# Patient Record
Sex: Male | Born: 1945 | Race: White | Hispanic: No | Marital: Married | Smoking: Former smoker
Health system: Southern US, Community
[De-identification: ages and names within clinical notes are randomized; demographics above are authoritative.]

## PROBLEM LIST (undated history)

## (undated) DIAGNOSIS — I4891 Unspecified atrial fibrillation: Secondary | ICD-10-CM

## (undated) DIAGNOSIS — E785 Hyperlipidemia, unspecified: Secondary | ICD-10-CM

## (undated) DIAGNOSIS — E119 Type 2 diabetes mellitus without complications: Secondary | ICD-10-CM

## (undated) DIAGNOSIS — I2510 Atherosclerotic heart disease of native coronary artery without angina pectoris: Secondary | ICD-10-CM

## (undated) DIAGNOSIS — I340 Nonrheumatic mitral (valve) insufficiency: Secondary | ICD-10-CM

## (undated) DIAGNOSIS — C4490 Unspecified malignant neoplasm of skin, unspecified: Secondary | ICD-10-CM

## (undated) DIAGNOSIS — I071 Rheumatic tricuspid insufficiency: Secondary | ICD-10-CM

## (undated) DIAGNOSIS — I9789 Other postprocedural complications and disorders of the circulatory system, not elsewhere classified: Secondary | ICD-10-CM

## (undated) DIAGNOSIS — K219 Gastro-esophageal reflux disease without esophagitis: Secondary | ICD-10-CM

## (undated) DIAGNOSIS — IMO0002 Reserved for concepts with insufficient information to code with codable children: Secondary | ICD-10-CM

## (undated) DIAGNOSIS — M069 Rheumatoid arthritis, unspecified: Secondary | ICD-10-CM

## (undated) DIAGNOSIS — I10 Essential (primary) hypertension: Secondary | ICD-10-CM

## (undated) DIAGNOSIS — I255 Ischemic cardiomyopathy: Secondary | ICD-10-CM

## (undated) HISTORY — DX: Ischemic cardiomyopathy: I25.5

## (undated) HISTORY — DX: Unspecified atrial fibrillation: I48.91

## (undated) HISTORY — DX: Rheumatic tricuspid insufficiency: I07.1

## (undated) HISTORY — DX: Reserved for concepts with insufficient information to code with codable children: IMO0002

## (undated) HISTORY — DX: Essential (primary) hypertension: I10

## (undated) HISTORY — DX: Hyperlipidemia, unspecified: E78.5

## (undated) HISTORY — DX: Other postprocedural complications and disorders of the circulatory system, not elsewhere classified: I97.89

## (undated) HISTORY — DX: Atherosclerotic heart disease of native coronary artery without angina pectoris: I25.10

## (undated) HISTORY — DX: Nonrheumatic mitral (valve) insufficiency: I34.0

## (undated) HISTORY — DX: Other postprocedural complications and disorders of the circulatory system, not elsewhere classified: I48.91

## (undated) HISTORY — PX: INGUINAL HERNIA REPAIR: SUR1180

---

## 2013-05-11 ENCOUNTER — Encounter (HOSPITAL_COMMUNITY): Payer: Self-pay | Admitting: Cardiology

## 2013-05-11 ENCOUNTER — Inpatient Hospital Stay (HOSPITAL_COMMUNITY)
Admission: AD | Admit: 2013-05-11 | Discharge: 2013-05-21 | DRG: 234 | Disposition: A | Discharge status: Home / Self Care | Payer: Medicare Other | Source: Other Acute Inpatient Hospital | Attending: Cardiothoracic Surgery | Admitting: Cardiothoracic Surgery

## 2013-05-11 DIAGNOSIS — Z87891 Personal history of nicotine dependence: Secondary | ICD-10-CM

## 2013-05-11 DIAGNOSIS — J90 Pleural effusion, not elsewhere classified: Secondary | ICD-10-CM | POA: Diagnosis not present

## 2013-05-11 DIAGNOSIS — I480 Paroxysmal atrial fibrillation: Secondary | ICD-10-CM

## 2013-05-11 DIAGNOSIS — Z951 Presence of aortocoronary bypass graft: Secondary | ICD-10-CM

## 2013-05-11 DIAGNOSIS — K219 Gastro-esophageal reflux disease without esophagitis: Secondary | ICD-10-CM | POA: Diagnosis present

## 2013-05-11 DIAGNOSIS — R609 Edema, unspecified: Secondary | ICD-10-CM

## 2013-05-11 DIAGNOSIS — Z79899 Other long term (current) drug therapy: Secondary | ICD-10-CM

## 2013-05-11 DIAGNOSIS — J4489 Other specified chronic obstructive pulmonary disease: Secondary | ICD-10-CM | POA: Diagnosis present

## 2013-05-11 DIAGNOSIS — J449 Chronic obstructive pulmonary disease, unspecified: Secondary | ICD-10-CM | POA: Diagnosis present

## 2013-05-11 DIAGNOSIS — I2510 Atherosclerotic heart disease of native coronary artery without angina pectoris: Secondary | ICD-10-CM

## 2013-05-11 DIAGNOSIS — E119 Type 2 diabetes mellitus without complications: Secondary | ICD-10-CM | POA: Diagnosis present

## 2013-05-11 DIAGNOSIS — Z8249 Family history of ischemic heart disease and other diseases of the circulatory system: Secondary | ICD-10-CM

## 2013-05-11 DIAGNOSIS — I10 Essential (primary) hypertension: Secondary | ICD-10-CM | POA: Diagnosis present

## 2013-05-11 DIAGNOSIS — I4891 Unspecified atrial fibrillation: Secondary | ICD-10-CM | POA: Diagnosis not present

## 2013-05-11 DIAGNOSIS — E785 Hyperlipidemia, unspecified: Secondary | ICD-10-CM | POA: Diagnosis present

## 2013-05-11 DIAGNOSIS — D62 Acute posthemorrhagic anemia: Secondary | ICD-10-CM | POA: Diagnosis not present

## 2013-05-11 DIAGNOSIS — E8779 Other fluid overload: Secondary | ICD-10-CM | POA: Diagnosis not present

## 2013-05-11 DIAGNOSIS — I214 Non-ST elevation (NSTEMI) myocardial infarction: Principal | ICD-10-CM | POA: Diagnosis present

## 2013-05-11 DIAGNOSIS — I059 Rheumatic mitral valve disease, unspecified: Secondary | ICD-10-CM | POA: Diagnosis present

## 2013-05-11 DIAGNOSIS — I2789 Other specified pulmonary heart diseases: Secondary | ICD-10-CM | POA: Diagnosis present

## 2013-05-11 DIAGNOSIS — M069 Rheumatoid arthritis, unspecified: Secondary | ICD-10-CM | POA: Diagnosis present

## 2013-05-11 HISTORY — DX: Gastro-esophageal reflux disease without esophagitis: K21.9

## 2013-05-11 HISTORY — DX: Rheumatoid arthritis, unspecified: M06.9

## 2013-05-11 HISTORY — DX: Unspecified malignant neoplasm of skin, unspecified: C44.90

## 2013-05-11 HISTORY — DX: Type 2 diabetes mellitus without complications: E11.9

## 2013-05-11 LAB — HEPARIN LEVEL (UNFRACTIONATED)

## 2013-05-11 LAB — PROTIME-INR
INR: 1.17 (ref 0.00–1.49)
Prothrombin Time: 14.7 seconds (ref 11.6–15.2)

## 2013-05-11 LAB — MRSA PCR SCREENING: MRSA by PCR: NEGATIVE

## 2013-05-11 LAB — GLUCOSE, CAPILLARY
GLUCOSE-CAPILLARY: 86 mg/dL (ref 70–99)
Glucose-Capillary: 107 mg/dL — ABNORMAL HIGH (ref 70–99)

## 2013-05-11 LAB — TROPONIN I: Troponin I: 4.62 ng/mL (ref <0.30)

## 2013-05-11 MED ORDER — CYCLOSPORINE 100 MG PO CAPS
100.0000 mg | ORAL_CAPSULE | Freq: Every day | ORAL | Status: DC
  Administered 2013-05-12 – 2013-05-15 (×4): 100 mg via ORAL
  Filled 2013-05-11 (×6): qty 1

## 2013-05-11 MED ORDER — NITROGLYCERIN 2 % TD OINT
0.5000 [in_us] | TOPICAL_OINTMENT | Freq: Four times a day (QID) | TRANSDERMAL | Status: DC
  Administered 2013-05-11 – 2013-05-16 (×18): 0.5 [in_us] via TOPICAL
  Filled 2013-05-11 (×4): qty 30

## 2013-05-11 MED ORDER — SODIUM CHLORIDE 0.9 % IJ SOLN
3.0000 mL | Freq: Two times a day (BID) | INTRAMUSCULAR | Status: DC
  Administered 2013-05-11: 3 mL via INTRAVENOUS

## 2013-05-11 MED ORDER — ALPRAZOLAM 0.25 MG PO TABS: 0.2500 mg | ORAL_TABLET | Freq: Two times a day (BID) | ORAL | Status: DC | PRN

## 2013-05-11 MED ORDER — ZOLPIDEM TARTRATE 5 MG PO TABS
5.0000 mg | ORAL_TABLET | Freq: Every evening | ORAL | Status: DC | PRN
  Administered 2013-05-11 – 2013-05-15 (×2): 5 mg via ORAL
  Filled 2013-05-11 (×2): qty 1

## 2013-05-11 MED ORDER — SODIUM CHLORIDE 0.9 % IJ SOLN: 3.0000 mL | INTRAMUSCULAR | Status: DC | PRN

## 2013-05-11 MED ORDER — ONDANSETRON HCL 4 MG/2ML IJ SOLN: 4.0000 mg | Freq: Four times a day (QID) | INTRAMUSCULAR | Status: DC | PRN

## 2013-05-11 MED ORDER — METOPROLOL TARTRATE 12.5 MG HALF TABLET
12.5000 mg | ORAL_TABLET | Freq: Two times a day (BID) | ORAL | Status: DC
  Administered 2013-05-11 – 2013-05-14 (×7): 12.5 mg via ORAL
  Filled 2013-05-11 (×10): qty 1

## 2013-05-11 MED ORDER — PNEUMOCOCCAL VAC POLYVALENT 25 MCG/0.5ML IJ INJ
0.5000 mL | INJECTION | INTRAMUSCULAR | Status: DC
  Filled 2013-05-11 (×2): qty 0.5

## 2013-05-11 MED ORDER — NITROGLYCERIN 0.4 MG SL SUBL
0.4000 mg | SUBLINGUAL_TABLET | SUBLINGUAL | Status: DC | PRN
  Administered 2013-05-15 (×2): 0.4 mg via SUBLINGUAL
  Filled 2013-05-11: qty 25

## 2013-05-11 MED ORDER — ASPIRIN EC 81 MG PO TBEC
81.0000 mg | DELAYED_RELEASE_TABLET | Freq: Every day | ORAL | Status: DC
  Administered 2013-05-13 – 2013-05-15 (×3): 81 mg via ORAL
  Filled 2013-05-11 (×6): qty 1

## 2013-05-11 MED ORDER — ACETAMINOPHEN 325 MG PO TABS
650.0000 mg | ORAL_TABLET | ORAL | Status: DC | PRN
Start: 2013-05-11 — End: 2013-05-16
  Administered 2013-05-12 – 2013-05-14 (×4): 650 mg via ORAL
  Filled 2013-05-11 (×5): qty 2

## 2013-05-11 MED ORDER — HEPARIN (PORCINE) IN NACL 100-0.45 UNIT/ML-% IJ SOLN
1400.0000 [IU]/h | INTRAMUSCULAR | Status: DC
  Filled 2013-05-11 (×3): qty 250

## 2013-05-11 MED ORDER — INSULIN ASPART 100 UNIT/ML ~~LOC~~ SOLN
0.0000 [IU] | Freq: Every day | SUBCUTANEOUS | Status: DC
  Administered 2013-05-12 – 2013-05-15 (×2): 2 [IU] via SUBCUTANEOUS

## 2013-05-11 MED ORDER — SODIUM CHLORIDE 0.9 % IV SOLN: 250.0000 mL | INTRAVENOUS | Status: DC | PRN

## 2013-05-11 MED ORDER — LISINOPRIL 10 MG PO TABS
10.0000 mg | ORAL_TABLET | Freq: Every day | ORAL | Status: DC
  Administered 2013-05-13 – 2013-05-15 (×3): 10 mg via ORAL
  Filled 2013-05-11 (×6): qty 1

## 2013-05-11 MED ORDER — GLIMEPIRIDE 4 MG PO TABS
4.0000 mg | ORAL_TABLET | Freq: Every day | ORAL | Status: DC
  Administered 2013-05-13 – 2013-05-15 (×3): 4 mg via ORAL
  Filled 2013-05-11 (×7): qty 1

## 2013-05-11 MED ORDER — METHOTREXATE 2.5 MG PO TABS: 2.5000 mg | ORAL_TABLET | ORAL | Status: DC

## 2013-05-11 MED ORDER — INSULIN ASPART 100 UNIT/ML ~~LOC~~ SOLN
0.0000 [IU] | Freq: Three times a day (TID) | SUBCUTANEOUS | Status: DC
  Administered 2013-05-12: 5 [IU] via SUBCUTANEOUS
  Administered 2013-05-13: 3 [IU] via SUBCUTANEOUS
  Administered 2013-05-13 – 2013-05-14 (×2): 2 [IU] via SUBCUTANEOUS
  Administered 2013-05-14: 3 [IU] via SUBCUTANEOUS
  Administered 2013-05-15 (×2): 2 [IU] via SUBCUTANEOUS

## 2013-05-11 NOTE — Progress Notes (Signed)
ANTICOAGULATION CONSULT NOTE - Initial Consult  Pharmacy Consult for Heparin Indication: chest pain/ACS  No Known Allergies  Patient Measurements: Height: 5\' 8"  (172.7 cm) Weight: 203 lb 0.7 oz (92.1 kg) IBW/kg (Calculated) : 68.4   Vital Signs: Temp: 98.7 F (37.1 C) (02/25 2022) Temp src: Oral (02/25 2022) BP: 105/62 mmHg (02/25 2022) Pulse Rate: 85 (02/25 2022)  Labs:  Recent Labs  05/11/13 1929  LABPROT 14.7  INR 1.17  HEPARINUNFRC <0.10*  TROPONINI 4.62*    CrCl is unknown because no creatinine reading has been taken.   Medical History: Past Medical History  Diagnosis Date  . HTN (hypertension)   . Dyslipidemia   . 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 Diabetes mellitus, type II   . GERD (gastroesophageal reflux disease)   . Rheumatoid arthritis   . NSTEMI (non-ST elevated myocardial infarction) 04/2013      Assessment: 67yom admitted for chest pain and Tp 10 at [facility].  Heparin 5000 ut bolus and drip 1240 uts/hr started at [facility], 6hr HL < 0.1.  No bleeding noted.    Goal of Therapy:  Heparin level 0.3-0.7 units/ml Monitor platelets by anticoagulation protocol: Yes   Plan:  Increase Heparin drip 1400 uts/hr Daily HL, CBC  Bonnita Nasuti Pharm.D. CPP, BCPS Clinical Pharmacist [phone] 05/11/2013 8:56 PM

## 2013-05-11 NOTE — Progress Notes (Signed)
Patient's on methotrexate PTA for RA.  He informed me that at home he takes 2.5mg  daily except none on Sundays.  The typical frequency for this drug is once weekly to minimize side effects.  [facility] reported that the prescription they got from his endocrinologist Dr. Katheren Puller had instructions for him to take 6 tablets (15mg ) once weekly.  Spoke to Dr. Ellyn Hack regarding this situation--per Dr. Ellyn Hack-- hold methotrexate for now.  Dia Sitter, PharmD, BCPS

## 2013-05-11 NOTE — H&P (Signed)
     Patient ID: ORVA GWALTNEY MRN: [id number], DOB/AGE: 1945-09-25   Admit date: 05/11/2013   Primary Physician: Dr Alyson Locket, Valarie Cones Primary Cardiologist: Dr Ellyn Hack (new)  HPI: 68 y/o from [place] with no prior history of CAD. He does have diabetes, HTN, dyslipidemia. Today after lunch he was carrying groceries in when he developed Lt sided chest pain associated with SOB, diaphoresis, and nausea. He thought he pulled a muscle and went in to rest. His symptoms persisted and he went to the ER at [facility]. His Troponin is 10, his EKG shows no acute changes. He is currently pain free on Heparin.    Problem List: Past Medical History  Diagnosis Date  . HTN (hypertension)   . Dyslipidemia   . Rheumatoid arthritis   . Diabetes mellitus, type II     No past surgical history on file.   Allergies: No Known Allergies   Home Medications No current facility-administered medications for this encounter.     Family History  Problem Relation Age of Onset  . Coronary artery disease Brother      History   Social History  . Marital Status: Married    Spouse Name: N/A    Number of Children: N/A  . Years of Education: N/A   Occupational History  . Not on file.   Social History Main Topics  . Smoking status: Former Research scientist (life sciences)  . Smokeless tobacco: Not on file     Comment: quit 40 yrs ago  . Alcohol Use: No  . Drug Use: Not on file  . Sexual Activity: Not on file   Other Topics Concern  . Not on file   Social History Narrative  . No narrative on file     Review of Systems: General: negative for chills, fever, night sweats or weight changes.  Cardiovascular: negative for chest pain, dyspnea on exertion, edema, orthopnea, palpitations, paroxysmal nocturnal dyspnea or shortness of breath Dermatological: negative for rash Respiratory: negative for cough or wheezing Urologic: negative for hematuria Abdominal: negative for nausea, vomiting, diarrhea,  bright red blood per rectum, melena, or hematemesis Neurologic: negative for visual changes, syncope, or dizziness All other systems reviewed and are otherwise negative except as noted above.  Physical Exam: Blood pressure 109/71, pulse 76, temperature 98 F (36.7 C), temperature source Oral, resp. rate 17, height 5\' 8"  (1.727 m), weight 203 lb 0.7 oz (92.1 kg), SpO2 94.00%.  General appearance: alert, cooperative and no distress Neck: no carotid bruit and no JVD Lungs: clear to auscultation bilaterally Heart: regular rate and rhythm Abdomen: soft, non-tender; bowel sounds normal; no masses,  no organomegaly Extremities: extremities normal, atraumatic, no cyanosis or edema Pulses: 2+ and symmetric Skin: Skin color, texture, turgor normal. No rashes or lesions Neurologic: Grossly normal    Labs:  No results found for this or any previous visit (from the past 24 hour(s)).   Radiology/Studies: No results found.  EKG NSR without acute changes  ASSESSMENT AND PLAN:  Principal Problem:   NSTEMI (non-ST elevated myocardial infarction) Active Problems:   Diabetes mellitus   Dyslipidemia   HTN (hypertension)   Rheumatoid arthritis   PLAN: Continue Heparin, add NTG and beta blocker. Cath in am. If his cath is normal he will need a CTA (D-dimer 3.52).    Henri Medal, PA-C 05/11/2013, 4:55 PM

## 2013-05-11 NOTE — H&P (Signed)
I have seen and evaluated the patient this PM along with Kerin Ransom, PA. I agree with his findings, examination as well as impression recommendations.  Very pleasant 68 year old gentleman with long-standing Rheumatoid Arthritis, diabetes hypertension and hyperlipidemia who presents now with five-day history of significant dyspnea and chest pressure with any exertion. Initial symptom was on Friday night February 20 when it is bringing groceries. He noticed left arm numbness and a significant sharp pressure on the left side of his chest associated with dyspnea and diaphoresis. This lasted about 5-10 minutes. Since then he has been profoundly dyspneic with exertion and has the pressure up he does much more than just walk around the room. He has not had any edema. He does note that he has been noticing some exertional dyspnea over the last month or 2.  He went to [facility] ER and was found to have an elevated troponin of roughly 10 with a d-dimer of 3.5.  He has a mild S4 gallop on exam, and mild mild basal crackles/rales. He is currently pain-free with nitro paste in place. He was transferred on IV heparin and was started on beta blocker.  We discussed the potential course of action: Early invasive versus noninvasive/conserve. In this gentleman with clear symptoms that were probably post infarct angina and heart failure symptoms as I think his MI may have been Friday night, the most prudent course of action would be to proceed with cardiac catheterization in the morning.   I discussed the risks, benefits, alternatives and indications of the procedure with the patient in detail.  All questions were answered.    Risks / Complications include, but not limited to: Death, MI, CVA/TIA, VF/VT (with defibrillation), Bradycardia (need for temporary pacer placement), contrast induced nephropathy, bleeding / bruising / hematoma / pseudoaneurysm, vascular or coronary injury (with possible emergent CT or Vascular  Surgery), adverse medication reactions, infection.    The patient voiced understanding and agree to proceed.  He is posted for cath via hopefully radial approach in the morning.   Leonie Man, M.D., M.S. Interventional Cardiologist  [place] Pager # [phone] 05/11/2013

## 2013-05-12 ENCOUNTER — Other Ambulatory Visit: Payer: Self-pay | Admitting: *Deleted

## 2013-05-12 ENCOUNTER — Encounter (HOSPITAL_COMMUNITY)
Admission: AD | Disposition: A | Discharge status: Home / Self Care | Payer: Self-pay | Source: Other Acute Inpatient Hospital | Attending: Cardiothoracic Surgery

## 2013-05-12 ENCOUNTER — Inpatient Hospital Stay (HOSPITAL_COMMUNITY): Payer: Medicare Other

## 2013-05-12 DIAGNOSIS — I2510 Atherosclerotic heart disease of native coronary artery without angina pectoris: Secondary | ICD-10-CM

## 2013-05-12 DIAGNOSIS — I059 Rheumatic mitral valve disease, unspecified: Secondary | ICD-10-CM

## 2013-05-12 DIAGNOSIS — Z01810 Encounter for preprocedural cardiovascular examination: Secondary | ICD-10-CM

## 2013-05-12 HISTORY — PX: LEFT HEART CATHETERIZATION WITH CORONARY ANGIOGRAM: SHX5451

## 2013-05-12 LAB — BASIC METABOLIC PANEL
BUN: 31 mg/dL — ABNORMAL HIGH (ref 6–23)
CALCIUM: 8.5 mg/dL (ref 8.4–10.5)
CO2: 22 mEq/L (ref 19–32)
Chloride: 99 mEq/L (ref 96–112)
Creatinine, Ser: 1.12 mg/dL (ref 0.50–1.35)
GFR, EST AFRICAN AMERICAN: 76 mL/min — AB (ref >90)
GFR, EST NON AFRICAN AMERICAN: 65 mL/min — AB (ref >90)
Glucose, Bld: 91 mg/dL (ref 70–99)
Potassium: 4.3 mEq/L (ref 3.7–5.3)
SODIUM: 135 meq/L — AB (ref 137–147)

## 2013-05-12 LAB — GLUCOSE, CAPILLARY
GLUCOSE-CAPILLARY: 104 mg/dL — AB (ref 70–99)
GLUCOSE-CAPILLARY: 224 mg/dL — AB (ref 70–99)
Glucose-Capillary: 216 mg/dL — ABNORMAL HIGH (ref 70–99)

## 2013-05-12 LAB — PULMONARY FUNCTION TEST
FEF 25-75 Post: 0.74 L/sec
FEF 25-75 Pre: 0.5 L/sec
FEF2575-%Change-Post: 47 %
FEF2575-%Pred-Post: 31 %
FEF2575-%Pred-Pre: 21 %
FEV1-%Change-Post: 16 %
FEV1-%Pred-Post: 42 %
FEV1-%Pred-Pre: 36 %
FEV1-Post: 1.29 L
FEV1-Pre: 1.11 L
FEV1FVC-%Change-Post: 0 %
FEV1FVC-%Pred-Pre: 76 %
FEV6-%Change-Post: 15 %
FEV6-%Pred-Post: 57 %
FEV6-%Pred-Pre: 50 %
FEV6-Post: 2.24 L
FEV6-Pre: 1.94 L
FEV6FVC-%Change-Post: -1 %
FEV6FVC-%Pred-Post: 103 %
FEV6FVC-%Pred-Pre: 105 %
FVC-%Change-Post: 17 %
FVC-%Pred-Post: 55 %
FVC-%Pred-Pre: 47 %
FVC-Post: 2.29 L
FVC-Pre: 1.95 L
Post FEV1/FVC ratio: 56 %
Post FEV6/FVC ratio: 98 %
Pre FEV1/FVC ratio: 57 %
Pre FEV6/FVC Ratio: 100 %

## 2013-05-12 LAB — CBC
HCT: 34.9 % — ABNORMAL LOW (ref 39.0–52.0)
HEMOGLOBIN: 12.2 g/dL — AB (ref 13.0–17.0)
MCH: 32.4 pg (ref 26.0–34.0)
MCHC: 35 g/dL (ref 30.0–36.0)
MCV: 92.6 fL (ref 78.0–100.0)
PLATELETS: 147 10*3/uL — AB (ref 150–400)
RBC: 3.77 MIL/uL — ABNORMAL LOW (ref 4.22–5.81)
RDW: 15.1 % (ref 11.5–15.5)
WBC: 7.2 10*3/uL (ref 4.0–10.5)

## 2013-05-12 LAB — LIPID PANEL
CHOLESTEROL: 119 mg/dL (ref 0–200)
HDL: 15 mg/dL — ABNORMAL LOW (ref >39)
LDL Cholesterol: 78 mg/dL (ref 0–99)
TRIGLYCERIDES: 131 mg/dL (ref <150)
Total CHOL/HDL Ratio: 7.9 RATIO
VLDL: 26 mg/dL (ref 0–40)

## 2013-05-12 LAB — ABO/RH: ABO/RH(D): O NEG

## 2013-05-12 LAB — TROPONIN I
Troponin I: 5.36 ng/mL (ref <0.30)
Troponin I: 4.92 ng/mL (ref <0.30)

## 2013-05-12 LAB — TSH: TSH: 4.659 u[IU]/mL — ABNORMAL HIGH (ref 0.350–4.500)

## 2013-05-12 LAB — TYPE AND SCREEN
ABO/RH(D): O NEG
Antibody Screen: NEGATIVE

## 2013-05-12 LAB — SURGICAL PCR SCREEN
MRSA, PCR: NEGATIVE
Staphylococcus aureus: POSITIVE — AB

## 2013-05-12 LAB — PLATELET INHIBITION P2Y12: Platelet Function  P2Y12: 264 [PRU] (ref 194–418)

## 2013-05-12 LAB — HEPARIN LEVEL (UNFRACTIONATED): Heparin Unfractionated: <0.1 IU/mL — ABNORMAL LOW (ref 0.30–0.70)

## 2013-05-12 SURGERY — LEFT HEART CATHETERIZATION WITH CORONARY ANGIOGRAM
Anesthesia: LOCAL

## 2013-05-12 MED ORDER — ALBUTEROL SULFATE (2.5 MG/3ML) 0.083% IN NEBU
2.5000 mg | INHALATION_SOLUTION | Freq: Once | RESPIRATORY_TRACT | Status: AC
  Administered 2013-05-12: 16:00:00 2.5 mg via RESPIRATORY_TRACT

## 2013-05-12 MED ORDER — DIAZEPAM 5 MG PO TABS: 5.0000 mg | ORAL_TABLET | Freq: Once | ORAL | Status: DC

## 2013-05-12 MED ORDER — VERAPAMIL HCL 2.5 MG/ML IV SOLN
INTRAVENOUS | Status: AC
  Filled 2013-05-12: qty 2

## 2013-05-12 MED ORDER — HEPARIN (PORCINE) IN NACL 100-0.45 UNIT/ML-% IJ SOLN
2100.0000 [IU]/h | INTRAMUSCULAR | Status: DC
  Administered 2013-05-12: 1400 [IU]/h via INTRAVENOUS
  Administered 2013-05-13: 1800 [IU]/h via INTRAVENOUS
  Filled 2013-05-12 (×4): qty 250

## 2013-05-12 MED ORDER — TEMAZEPAM 15 MG PO CAPS: 15.0000 mg | ORAL_CAPSULE | Freq: Once | ORAL | Status: DC | PRN

## 2013-05-12 MED ORDER — CHLORHEXIDINE GLUCONATE 4 % EX LIQD: 60.0000 mL | Freq: Once | CUTANEOUS | Status: DC

## 2013-05-12 MED ORDER — METOPROLOL TARTRATE 12.5 MG HALF TABLET
12.5000 mg | ORAL_TABLET | Freq: Once | ORAL | Status: DC
  Filled 2013-05-12: qty 1

## 2013-05-12 MED ORDER — HEPARIN (PORCINE) IN NACL 2-0.9 UNIT/ML-% IJ SOLN
INTRAMUSCULAR | Status: AC
  Filled 2013-05-12: qty 1000

## 2013-05-12 MED ORDER — HEPARIN SODIUM (PORCINE) 1000 UNIT/ML IJ SOLN
INTRAMUSCULAR | Status: AC
  Filled 2013-05-12: qty 1

## 2013-05-12 MED ORDER — DIAZEPAM 5 MG PO TABS
5.0000 mg | ORAL_TABLET | ORAL | Status: AC
  Administered 2013-05-12: 5 mg via ORAL
  Filled 2013-05-12: qty 1

## 2013-05-12 MED ORDER — BISACODYL 5 MG PO TBEC: 5.0000 mg | DELAYED_RELEASE_TABLET | Freq: Once | ORAL | Status: DC

## 2013-05-12 MED ORDER — ALPRAZOLAM 0.25 MG PO TABS
0.2500 mg | ORAL_TABLET | ORAL | Status: DC | PRN
  Administered 2013-05-15: 0.25 mg via ORAL
  Filled 2013-05-12: qty 1

## 2013-05-12 MED ORDER — ATORVASTATIN CALCIUM 80 MG PO TABS
80.0000 mg | ORAL_TABLET | Freq: Every day | ORAL | Status: DC
  Administered 2013-05-12 – 2013-05-15 (×4): 80 mg via ORAL
  Filled 2013-05-12 (×7): qty 1

## 2013-05-12 MED ORDER — SODIUM CHLORIDE 0.9 % IV SOLN: INTRAVENOUS | Status: AC

## 2013-05-12 MED ORDER — CHLORHEXIDINE GLUCONATE 4 % EX LIQD
60.0000 mL | Freq: Once | CUTANEOUS | Status: DC
Start: 2013-05-12 — End: 2013-05-12

## 2013-05-12 MED ORDER — SODIUM CHLORIDE 0.9 % IV SOLN
1.0000 mL/kg/h | INTRAVENOUS | Status: DC
  Administered 2013-05-12: 1 mL/kg/h via INTRAVENOUS

## 2013-05-12 MED ORDER — BISACODYL 5 MG PO TBEC
5.0000 mg | DELAYED_RELEASE_TABLET | Freq: Once | ORAL | Status: DC
  Filled 2013-05-12: qty 1

## 2013-05-12 MED ORDER — LIDOCAINE HCL (PF) 1 % IJ SOLN
INTRAMUSCULAR | Status: AC
  Filled 2013-05-12: qty 30

## 2013-05-12 NOTE — Consult Note (Signed)
[address]       [place] [address]             [phone]        Witt R Metter Weston Medical Record #[id number] Date of Birth: 1945/03/19  Referring: No ref. provider found Primary Care: No primary provider on file.  Chief Complaint:   Chest pain  History of Present Illness:      Patient examined, coronary angiogram and echocardiogram reviewed 68 year old Caucasian diabetic male with severe rheumatoid arthritis was admitted yesterday with unstable angina and positive cardiac enzymes.  The patient states he developed severe substernal chest pain while carrying groceries in the cold air. The pain did not resolve with time so he presented to the emergency department at [facility]. He is found have positive cardiac enzymes and transferred to this hospital. Today cardiac catheterization demonstrates severe multivessel CAD. LVEDP is elevated at 25 mm mercury but EF is mildly reduced at 45%. Echocardiogram shows mild anterior hypokinesia with mild to moderate mitral regurgitation. The patient is currently stable following cardiac catheterization via right radial artery. He is on IV heparin protocol.  The patient has had we function testing which shows significant COPD--FEV1 1.1, FVC 2.0. Carotid Doppler showed no significant obstruction.  The patient has severe rheumatoid arthritis in his hands and feet. The patient is on methotrexate daily, 2.5 mg. The patient is on cyclosporine daily 100 mg daily. The patient is able to walk and functioning fairly well but he has difficulty using his hands. He lives with his wife at home in [place]. His rheumatologist is in a clinic at [facility].  Current Activity/ Functional Status: Limited by his rheumatoid arthritis but functioning fairly well at home with his wife's help   Zubrod Score: At the time of surgery this patient's most appropriate activity status/level should be described as: []     0    Normal  activity, no symptoms []     1    Restricted in physical strenuous activity but ambulatory, able to do out light work []     2    Ambulatory and capable of self care, unable to do work activities, up and about                 more than 50%  Of the time                            [x]     3    Only limited self care, in bed greater than 50% of waking hours []     4    Completely disabled, no self care, confined to bed or chair []     5    Moribund  Past Medical History  Diagnosis Date  . HTN (hypertension)   . Dyslipidemia   . 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 Diabetes mellitus, type II   . GERD (gastroesophageal reflux disease)   . Rheumatoid arthritis   . NSTEMI (non-ST elevated myocardial infarction) 04/2013    Past Surgical History  Procedure Laterality Date  . Inguinal hernia repair Left ~ 1970    History  Smoking status  . Former Smoker -- 2.00 packs/day for 10 years  . Types: Cigarettes  . Quit date: 09/15/1971  Smokeless tobacco  . Never Used    History  Alcohol Use No    History   Social  History  . Marital Status: Married    Spouse Name: N/A    Number of Children: N/A  . Years of Education: N/A   Occupational History  . Not on file.   Social History Main Topics  . Smoking status: Former Smoker -- 2.00 packs/day for 10 years    Types: Cigarettes    Quit date: 09/15/1971  . Smokeless tobacco: Never Used  . Alcohol Use: No  . Drug Use: No  . Sexual Activity: Yes   Other Topics Concern  . Not on file   Social History Narrative  . No narrative on file    No Known Allergies  Current Facility-Administered Medications  Medication Dose Route Frequency Provider Last Rate Last Dose  . 0.9 %  sodium chloride infusion  250 mL Intravenous PRN Erlene Quan, PA-C      . acetaminophen (TYLENOL) tablet 650 mg  650 mg Oral Q4H PRN Erlene Quan, PA-C   650 mg at 05/12/13 1537  . ALPRAZolam Duanne Moron) tablet 0.25 mg  0.25 mg Oral BID PRN Erlene Quan,  PA-C      . ALPRAZolam Duanne Moron) tablet 0.25-0.5 mg  0.25-0.5 mg Oral Q4H PRN [name], MD      . aspirin EC tablet 81 mg  81 mg Oral Daily Luke K Kilroy, PA-C      . atorvastatin (LIPITOR) tablet 80 mg  80 mg Oral q1800 [name], MD   80 mg at 05/12/13 1829  . cycloSPORINE (SANDIMMUNE) capsule 100 mg  100 mg Oral Daily Erlene Quan, PA-C   100 mg at 05/12/13 1137  . glimepiride (AMARYL) tablet 4 mg  4 mg Oral Q breakfast Luke K Kilroy, PA-C      . heparin ADULT infusion 100 units/mL (25000 units/250 mL)  1,400 Units/hr Intravenous Continuous Manley Mason, RPH 14 mL/hr at 05/12/13 1827 1,400 Units/hr at 05/12/13 1827  . insulin aspart (novoLOG) injection 0-15 Units  0-15 Units Subcutaneous TID WC Erlene Quan, PA-C   5 Units at 05/12/13 1827  . insulin aspart (novoLOG) injection 0-5 Units  0-5 Units Subcutaneous QHS Luke K Kilroy, PA-C      . lisinopril (PRINIVIL,ZESTRIL) tablet 10 mg  10 mg Oral Daily Luke K Kilroy, PA-C      . metoprolol tartrate (LOPRESSOR) tablet 12.5 mg  12.5 mg Oral BID Doreene Burke Kilroy, PA-C   12.5 mg at 05/12/13 1138  . nitroGLYCERIN (NITROGLYN) 2 % ointment 0.5 inch  0.5 inch Topical 4 times per day Erlene Quan, PA-C   0.5 inch at 05/12/13 0559  . nitroGLYCERIN (NITROSTAT) SL tablet 0.4 mg  0.4 mg Sublingual Q5 Min x 3 PRN Doreene Burke Kilroy, PA-C      . ondansetron [facility]) injection 4 mg  4 mg Intravenous Q6H PRN Erlene Quan, PA-C      . pneumococcal 23 valent vaccine (PNU-IMMUNE) injection 0.5 mL  0.5 mL Intramuscular Tomorrow-1000 Leonie Man, MD      . sodium chloride 0.9 % injection 3 mL  3 mL Intravenous Q12H Erlene Quan, PA-C   3 mL at 05/11/13 2158  . sodium chloride 0.9 % injection 3 mL  3 mL Intravenous PRN Erlene Quan, PA-C      . zolpidem (AMBIEN) tablet 5 mg  5 mg Oral QHS PRN Erlene Quan, PA-C   5 mg at 05/11/13 2157    Prescriptions prior to admission  Medication Sig Dispense Refill  .  cycloSPORINE (SANDIMMUNE) 100 MG capsule Take 1  capsule by mouth daily.      [name] glimepiride (AMARYL) 4 MG tablet Take 1 tablet by mouth daily.      [name] lisinopril (PRINIVIL,ZESTRIL) 10 MG tablet Take 1 tablet by mouth daily.      . methotrexate (RHEUMATREX) 2.5 MG tablet Take 1 tablet by mouth See admin instructions. On ALL days except Sunday.      [name] NIFEdipine (PROCARDIA-XL/ADALAT-CC/NIFEDICAL-XL) 30 MG 24 hr tablet Take 1 tablet by mouth daily.        Family History  Problem Relation Age of Onset  . Coronary artery disease Brother      Review of Systems:  The patient is right-hand dominant patient denies previous thoracic trauma Previous surgical history is positive for left inguinal repair without complication He has had rheumatoid arthritis since age 17 and he was forced to retire He states his diabetes is well-controlled with oral medication PFTs improved with bronchodilator   Cardiac Review of Systems: Y or N  Chest Pain [  yes  ]  Resting SOB [  no ] Exertional SOB  [ yes ]  Orthopnea [ no ]   Pedal Edema [no   ]    Palpitations [ no ] Syncope  [ no ]   Presyncope [no   ]  General Review of Systems: [Y] = yes [  ]=no Constitional: recent weight change [  ]; anorexia [  ]; fatigue [  ]; nausea [  ]; night sweats [  ]; fever [  ]; or chills [  ]                                                               Dental: poor dentition[ yes ]; Last Dentist visit:Greater than one year   Eye : blurred vision [  ]; diplopia [   ]; vision changes [  ];  Amaurosis fugax[  ]; Resp: cough [  ];  wheezing[  ];  hemoptysis[  ]; shortness of breath[  ]; paroxysmal nocturnal dyspnea[  ]; dyspnea on exertion[yes ]; or orthopnea[  ];  GI:  gallstones[  ], vomiting[  ];  dysphagia[  ]; melena[  ];  hematochezia [  ]; heartburn[  ];   Hx of  Colonoscopy[  ]; GU: kidney stones [  ]; hematuria[  ];   dysuria [  ];  nocturia[  ];  history of     obstruction [  ]; urinary frequency [  ]             Skin: rash, swelling[  ];, hair loss[  ];  peripheral  edema[  ];  or itching[  ]; Musculosketetal: myalgias[  ];  joint swelling[ yes ];  joint erythema[  ];  joint pain[yes  ];  back pain[yes, patient states he is having pain in his lower neck hospitalization  ];  Heme/Lymph: bruising[  ];  bleeding[  ];  anemia[  ];  Neuro: TIA[  ];  headaches[  ];  stroke[  ];  vertigo[  ];  seizures[  ];   paresthesias[  ];  difficulty walking[  ];  Psych:depression[  ]; anxiety[  ];  Endocrine: diabetes[ yes ];  thyroid dysfunction[  ];  Immunizations: Flu [  ];  Pneumococcal[  ];  Other:  Physical Exam: BP 122/74  Pulse 89  Temp(Src) 98.4 F (36.9 C) (Oral)  Resp 18  Ht 5\' 8"  (1.727 m)  Wt 203 lb 0.7 oz (92.1 kg)  BMI 30.88 kg/m2  SpO2 97%  Exam General appearance-elderly chronically ill male alert and comfortable accompanied by wife HEENT-normocephalic, poor dentition, equal pupils Neck-no JVD mass or bruit no palpable adenopathy Thorax - [no deformity or tenderness, breath sounds distant Cardiac-regular rhythm positive S4 no murmur Abdomen-obese soft nontender without pulsatile mass Extremities-severe bilateral hand and finger deformities, bilateral foot and toe deformities from rheumatoid arthritis Vascular-palpable pulses in the extremities no clear evidence of venous insufficiency of the legs, compression dressing over right wrist from cath Neuro no focal motor deficit   Diagnostic Studies & Laboratory data:     Recent Radiology Findings:   No results found.  Chest x-ray, portable shows no active disease PA and lateral chest x-ray pending  Recent Lab Findings: Lab Results  Component Value Date   WBC 7.2 05/12/2013   HGB 12.2* 05/12/2013   HCT 34.9* 05/12/2013   PLT 147* 05/12/2013   GLUCOSE 91 05/12/2013   CHOL 119 05/12/2013   TRIG 131 05/12/2013   HDL 15* 05/12/2013   LDLCALC 78 05/12/2013   NA 135* 05/12/2013   K 4.3 05/12/2013   CL 99 05/12/2013   CREATININE 1.12 05/12/2013   BUN 31* 05/12/2013   CO2 22 05/12/2013   TSH 4.659*  05/11/2013   INR 1.17 05/11/2013      Assessment / Plan:     Unstable angina with non-ST elevation MI and severe three-vessel CAD with mild to moderate LV dysfunction  Severe rheumatoid arthritis on immunosuppression chronically  COPD by PFTs with remote history of smoking   Plan-prepare for multivessel CABG Monday, March 2 Procedure indications risks and benefits discussed with patient and wife.    @ME1 @ 05/12/2013 6:43 PM

## 2013-05-12 NOTE — Interval H&P Note (Signed)
History and Physical Interval Note:  05/12/2013 7:41 AM  Francis Wilkinson  has presented today for surgery, with the diagnosis of cp  The various methods of treatment have been discussed with the patient and family. After consideration of risks, benefits and other options for treatment, the patient has consented to  Procedure(s): LEFT HEART CATHETERIZATION WITH CORONARY ANGIOGRAM (N/A) as a surgical intervention .  The patient's history has been reviewed, patient examined, no change in status, stable for surgery.  I have reviewed the patient's chart and labs.  Questions were answered to the patient's satisfaction.   Cath Lab Visit (complete for each Cath Lab visit)  Clinical Evaluation Leading to the Procedure:   ACS: yes  Non-ACS:    Anginal Classification: CCS III  Anti-ischemic medical therapy: Minimal Therapy (1 class of medications)  Non-Invasive Test Results: No non-invasive testing performed  Prior CABG: No previous CABG        Francis Wilkinson [facility] 05/12/2013 7:41 AM

## 2013-05-12 NOTE — CV Procedure (Signed)
    Cardiac Catheterization Procedure Note  Name: Francis Wilkinson MRN: [id number] DOB: Oct 13, 1945  Procedure: Left Heart Cath, Selective Coronary Angiography, LV angiography  Indication: 68 yo WM with history of DM and RA presents with a NSTEMI.   Procedural Details: The right wrist was prepped, draped, and anesthetized with 1% lidocaine. Using the modified Seldinger technique, a 5 French sheath was introduced into the right radial artery. 3 mg of verapamil was administered through the sheath, weight-based unfractionated heparin was administered intravenously. Standard Judkins catheters were used for selective coronary angiography and left ventriculography. Catheter exchanges were performed over an exchange length guidewire. There were no immediate procedural complications. A TR band was used for radial hemostasis at the completion of the procedure.  The patient was transferred to the post catheterization recovery area for further monitoring.  Procedural Findings: Hemodynamics: AO 101/66 mean 83 mm Hg LV 102/27 mm Hg  Coronary angiography: Coronary dominance: right  Left mainstem: Mild irregularities.  Left anterior descending (LAD): Severely calcified proximally. Diffuse 30% proximal. There is a 95% stenosis in the mid vessel after a small diagonal.   Left circumflex (LCx): 70% proximal. The first OM is occluded with faint collateral flow. The second OM is without significant disease.   Right coronary artery (RCA): The RCA is a large ectatic vessel. It is severely calcified throughout. There is segmental 40% disease in the proximal to mid vessel with a 60-70% lesion at the crux. The PDA has 60-70% disease proximally. The second PLOM branch is occluded and fills by left to right collaterals.  Left ventriculography: Left ventricular systolic function is abnormal, there is moderate to severe anterior hypokinesis,  LVEF is estimated at 40-45%%, there is no significant mitral regurgitation     Final Conclusions:   1. Severe 3 vessel obstructive CAD 2. Moderate LV dysfunction.  Recommendations: Recommend CT surgery consult for CABG. Will check Echo today.  [name] Perkins [facility] 05/12/2013, 8:14 AM

## 2013-05-12 NOTE — Progress Notes (Signed)
Utilization review completed.  

## 2013-05-12 NOTE — Progress Notes (Signed)
VASCULAR LAB PRELIMINARY  PRELIMINARY  PRELIMINARY  PRELIMINARY  Pre-op Cardiac Surgery  Carotid Findings:  No significant ICA stenosis bilaterally.  Moderate non calcific irregular plaque in left  Proximal ICA.  Vertebral artery flow antegrade.  Upper Extremity Right Left  Brachial Pressures 114 114  Radial Waveforms triphasic triphasic  Ulnar Waveforms triphasic triphasic  Palmar Arch (Allen's Test) normal Normal   Findings:      Lower  Extremity Right Left  Dorsalis Pedis 156 tri 152 tri  Anterior Tibial    Posterior Tibial 158 tri 140 tri  Ankle/Brachial Indices 1.39 1.33    Findings:     Francis Wilkinson, RVT 05/12/2013, 5:55 PM

## 2013-05-12 NOTE — Progress Notes (Signed)
  Echocardiogram 2D Echocardiogram has been performed.  Francis Wilkinson Francis Wilkinson 05/12/2013, 12:44 PM

## 2013-05-12 NOTE — Progress Notes (Signed)
ANTICOAGULATION CONSULT NOTE - Follow Up Consult  Pharmacy Consult for Heparin Indication: chest pain/ACS  No Known Allergies  Patient Measurements: Height: 5\' 8"  (172.7 cm) Weight: 203 lb 0.7 oz (92.1 kg) IBW/kg (Calculated) : 68.4 Heparin Dosing Weight: 87.5 kg  Vital Signs: Temp: 97.6 F (36.4 C) (02/26 1211) Temp src: Oral (02/26 1211) BP: 109/69 mmHg (02/26 1211) Pulse Rate: 88 (02/26 1211)  Labs:  Recent Labs  05/11/13 1929 05/12/13 0043 05/12/13 0530 05/12/13 0702  HGB  --  12.2*  --   --   HCT  --  34.9*  --   --   PLT  --  147*  --   --   LABPROT 14.7  --   --   --   INR 1.17  --   --   --   HEPARINUNFRC <0.10*  --  <0.10*  --   CREATININE  --  1.12  --   --   TROPONINI 4.62* 5.36*  --  4.92*    Estimated Creatinine Clearance: 68.6 ml/min (by C-G formula based on Cr of 1.12).  Assessment: 38 YOM transferred [facility] with chest pain, s/p cath this morning with severe 3V CAD Plan for CABG on 3/2. Pharmacy is consulted to resume IV heparin 8 hrs after sheath removal (band off at 1000 per RN). Hgb 12.2, plt 147. Previously heparin rate increased to 1400 units/hr  Goal of Therapy:  Heparin level 0.3-0.7 units/ml Monitor platelets by anticoagulation protocol: Yes   Plan:  - Restart haparin infusion 1400 units/hr at 1800 - f/u 6 hr heparin level at midnight. - daily heparin level and CBC - f/u plans for CABG  Maryanna Shape, PharmD, BCPS  Clinical Pharmacist  Pager: [phone]   05/12/2013,1:23 PM

## 2013-05-12 NOTE — Progress Notes (Signed)
TR BAND REMOVAL  LOCATION:  right radial  DEFLATED PER PROTOCOL:  yes  TIME BAND OFF / DRESSING APPLIED:   1030   SITE UPON ARRIVAL:   Level 0  SITE AFTER BAND REMOVAL:  Level 0  REVERSE ALLEN'S TEST:    positive  CIRCULATION SENSATION AND MOVEMENT:  Within Normal Limits  yes  COMMENTS:

## 2013-05-13 ENCOUNTER — Inpatient Hospital Stay (HOSPITAL_COMMUNITY): Payer: Medicare Other

## 2013-05-13 DIAGNOSIS — I2510 Atherosclerotic heart disease of native coronary artery without angina pectoris: Secondary | ICD-10-CM

## 2013-05-13 LAB — URINALYSIS, ROUTINE W REFLEX MICROSCOPIC
Bilirubin Urine: NEGATIVE
Glucose, UA: NEGATIVE mg/dL
Ketones, ur: NEGATIVE mg/dL
Leukocytes, UA: NEGATIVE
Nitrite: NEGATIVE
Protein, ur: 30 mg/dL — AB
Specific Gravity, Urine: 1.031 — ABNORMAL HIGH (ref 1.005–1.030)
Urobilinogen, UA: 2 mg/dL — ABNORMAL HIGH (ref 0.0–1.0)
pH: 6 (ref 5.0–8.0)

## 2013-05-13 LAB — COMPREHENSIVE METABOLIC PANEL
ALT: 16 U/L (ref 0–53)
AST: 25 U/L (ref 0–37)
Albumin: 2.6 g/dL — ABNORMAL LOW (ref 3.5–5.2)
Alkaline Phosphatase: 82 U/L (ref 39–117)
BUN: 27 mg/dL — ABNORMAL HIGH (ref 6–23)
CO2: 22 mEq/L (ref 19–32)
Calcium: 8.2 mg/dL — ABNORMAL LOW (ref 8.4–10.5)
Chloride: 101 mEq/L (ref 96–112)
Creatinine, Ser: 1.07 mg/dL (ref 0.50–1.35)
GFR calc Af Amer: 80 mL/min — ABNORMAL LOW (ref >90)
GFR calc non Af Amer: 69 mL/min — ABNORMAL LOW (ref >90)
Glucose, Bld: 160 mg/dL — ABNORMAL HIGH (ref 70–99)
Potassium: 4.2 mEq/L (ref 3.7–5.3)
Sodium: 135 mEq/L — ABNORMAL LOW (ref 137–147)
Total Bilirubin: 0.5 mg/dL (ref 0.3–1.2)
Total Protein: 6.9 g/dL (ref 6.0–8.3)

## 2013-05-13 LAB — BLOOD GAS, ARTERIAL
Acid-Base Excess: 0.3 mmol/L (ref 0.0–2.0)
Bicarbonate: 23.9 mEq/L (ref 20.0–24.0)
Drawn by: 27027
FIO2: 0.21 %
O2 Saturation: 96.3 %
Patient temperature: 98.7
TCO2: 25 mmol/L (ref 0–100)
pCO2 arterial: 35.4 mmHg (ref 35.0–45.0)
pH, Arterial: 7.444 (ref 7.350–7.450)
pO2, Arterial: 75.1 mmHg — ABNORMAL LOW (ref 80.0–100.0)

## 2013-05-13 LAB — CBC
HCT: 32.4 % — ABNORMAL LOW (ref 39.0–52.0)
HEMATOCRIT: 33.4 % — AB (ref 39.0–52.0)
Hemoglobin: 11.5 g/dL — ABNORMAL LOW (ref 13.0–17.0)
Hemoglobin: 11.3 g/dL — ABNORMAL LOW (ref 13.0–17.0)
MCH: 32.7 pg (ref 26.0–34.0)
MCH: 32 pg (ref 26.0–34.0)
MCHC: 34.4 g/dL (ref 30.0–36.0)
MCHC: 34.9 g/dL (ref 30.0–36.0)
MCV: 93.6 fL (ref 78.0–100.0)
MCV: 93 fL (ref 78.0–100.0)
PLATELETS: 143 10*3/uL — AB (ref 150–400)
Platelets: 151 10*3/uL (ref 150–400)
RBC: 3.59 MIL/uL — ABNORMAL LOW (ref 4.22–5.81)
RBC: 3.46 MIL/uL — AB (ref 4.22–5.81)
RDW: 15.1 % (ref 11.5–15.5)
RDW: 15.1 % (ref 11.5–15.5)
WBC: 7.1 10*3/uL (ref 4.0–10.5)
WBC: 6.3 10*3/uL (ref 4.0–10.5)

## 2013-05-13 LAB — HEMOGLOBIN A1C
Hgb A1c MFr Bld: 8.9 % — ABNORMAL HIGH (ref <5.7)
Mean Plasma Glucose: 209 mg/dL — ABNORMAL HIGH (ref <117)

## 2013-05-13 LAB — HEPARIN LEVEL (UNFRACTIONATED)
HEPARIN UNFRACTIONATED: 0.1 [IU]/mL — AB (ref 0.30–0.70)
Heparin Unfractionated: <0.1 IU/mL — ABNORMAL LOW (ref 0.30–0.70)
Heparin Unfractionated: 0.34 IU/mL (ref 0.30–0.70)

## 2013-05-13 LAB — URINE MICROSCOPIC-ADD ON

## 2013-05-13 LAB — GLUCOSE, CAPILLARY
GLUCOSE-CAPILLARY: 99 mg/dL (ref 70–99)
GLUCOSE-CAPILLARY: 166 mg/dL — AB (ref 70–99)
Glucose-Capillary: 154 mg/dL — ABNORMAL HIGH (ref 70–99)
Glucose-Capillary: 126 mg/dL — ABNORMAL HIGH (ref 70–99)

## 2013-05-13 MED ORDER — MUPIROCIN 2 % EX OINT
1.0000 "application " | TOPICAL_OINTMENT | Freq: Two times a day (BID) | CUTANEOUS | Status: DC
  Administered 2013-05-13 – 2013-05-15 (×7): 1 via NASAL
  Filled 2013-05-13 (×2): qty 22

## 2013-05-13 MED ORDER — CHLORHEXIDINE GLUCONATE CLOTH 2 % EX PADS
6.0000 | MEDICATED_PAD | Freq: Every day | CUTANEOUS | Status: DC
  Administered 2013-05-13 – 2013-05-15 (×3): 6 via TOPICAL

## 2013-05-13 MED ORDER — HEPARIN (PORCINE) IN NACL 100-0.45 UNIT/ML-% IJ SOLN
2200.0000 [IU]/h | INTRAMUSCULAR | Status: DC
  Administered 2013-05-13 (×2): 2100 [IU]/h via INTRAVENOUS
  Administered 2013-05-14 – 2013-05-15 (×3): 2200 [IU]/h via INTRAVENOUS
  Filled 2013-05-13 (×3): qty 250

## 2013-05-13 NOTE — Progress Notes (Signed)
ANTICOAGULATION CONSULT NOTE - Follow Up Consult  Pharmacy Consult for Heparin Indication: chest pain/ACS  No Known Allergies  Patient Measurements: Height: 5\' 8"  (172.7 cm) Weight: 207 lb 14.3 oz (94.3 kg) IBW/kg (Calculated) : 68.4 Heparin Dosing Weight: 87.5 kg  Vital Signs: Temp: 97.9 F (36.6 C) (02/27 1241) Temp src: Oral (02/27 1241) BP: 138/90 mmHg (02/27 1441) Pulse Rate: 101 (02/27 1441)  Labs:  Recent Labs  05/11/13 1929  05/12/13 0043 05/12/13 0530 05/12/13 0702 05/13/13 0011 05/13/13 0748  HGB  --   < > 12.2*  --   --  11.5* 11.3*  HCT  --   --  34.9*  --   --  33.4* 32.4*  PLT  --   --  147*  --   --  151 143*  LABPROT 14.7  --   --   --   --   --   --   INR 1.17  --   --   --   --   --   --   HEPARINUNFRC <0.10*  --   --  <0.10*  --  <0.10* 0.10*  CREATININE  --   --  1.12  --   --  1.07  --   TROPONINI 4.62*  --  5.36*  --  4.92*  --   --   < > = values in this interval not displayed.  Estimated Creatinine Clearance: 72.6 ml/min (by C-G formula based on Cr of 1.07).  Assessment: 88 YOM transferred from [facility] with chest pain, s/p cath 2/26 with severe 3V CAD Plan for CABG on 3/2. Currently on IV heparin, heparin level remains subtherapeutic on 1800 units/hr, no interruption with infusion per RN. Hgb 11.3, plt 143. No bleeding noted per chart.  Goal of Therapy:  Heparin level 0.3-0.7 units/ml Monitor platelets by anticoagulation protocol: Yes   Plan:  - Increase haparin to 2100 units/hr  - f/u 6 hr heparin level at 1600 - daily heparin level and CBC - f/u plans for CABG  Maryanna Shape, PharmD, BCPS  Clinical Pharmacist  Pager: [phone]   05/13/2013,3:01 PM

## 2013-05-13 NOTE — Progress Notes (Signed)
ANTICOAGULATION CONSULT NOTE - Follow Up Consult  Pharmacy Consult for heparin Indication: CAD awaiting CABG  Labs:  Recent Labs  05/11/13 1929 05/12/13 0043 05/12/13 0530 05/12/13 0702 05/13/13 0011  HGB  --  12.2*  --   --  11.5*  HCT  --  34.9*  --   --  33.4*  PLT  --  147*  --   --  151  LABPROT 14.7  --   --   --   --   INR 1.17  --   --   --   --   HEPARINUNFRC <0.10*  --  <0.10*  --  <0.10*  CREATININE  --  1.12  --   --  1.07  TROPONINI 4.62* 5.36*  --  4.92*  --     Assessment: 68yo male remains undetectable on heparin after resumed post-cath, now awaiting CABG Monday.  Goal of Therapy:  Heparin level 0.3-0.7 units/ml   Plan:  Will increase heparin gtt by 4 units/kg/hr to 1800 units/hr and check level in 6hr.  Wynona Neat, PharmD, BCPS  05/13/2013,2:00 AM

## 2013-05-13 NOTE — Progress Notes (Signed)
1 Day Post-Op Procedure(s) (LRB): LEFT HEART CATHETERIZATION WITH CORONARY ANGIOGRAM (N/A) Subjective: Some chest pain with card rehab- now painfree Chest CT- min aortic calcium, small effusions, no pulm nodules Neck CT w/o lytic lesions CBG's well controlled  Plan CABG mon Objective: Vital signs in last 24 hours: Temp:  [97.6 F (36.4 C)-98.4 F (36.9 C)] 97.6 F (36.4 C) (02/27 0737) Pulse Rate:  [72-89] 72 (02/27 0737) Cardiac Rhythm:  [-] Normal sinus rhythm (02/26 2000) Resp:  [18] 18 (02/27 0737) BP: (109-138)/(69-78) 120/69 mmHg (02/27 0737) SpO2:  [92 %-97 %] 97 % (02/27 0737) Weight:  [207 lb 14.3 oz (94.3 kg)] 207 lb 14.3 oz (94.3 kg) (02/27 0427)  Hemodynamic parameters for last 24 hours:  nsr  Intake/Output from previous day: 02/26 0701 - 02/27 0700 In: 924.2 [P.O.:480; I.V.:444.2] Out: 1200 [Urine:1200] Intake/Output this shift: Total I/O In: 240 [P.O.:240] Out: 300 [Urine:300]  Exam  No wrist hematoma Alert, comfortable Lab Results:  Recent Labs  05/13/13 0011 05/13/13 0748  WBC 7.1 6.3  HGB 11.5* 11.3*  HCT 33.4* 32.4*  PLT 151 143*   BMET:  Recent Labs  05/12/13 0043 05/13/13 0011  NA 135* 135*  K 4.3 4.2  CL 99 101  CO2 22 22  GLUCOSE 91 160*  BUN 31* 27*  CREATININE 1.12 1.07  CALCIUM 8.5 8.2*    PT/INR:  Recent Labs  05/11/13 1929  LABPROT 14.7  INR 1.17   ABG    Component Value Date/Time   PHART 7.444 05/13/2013 0430   HCO3 23.9 05/13/2013 0430   TCO2 25.0 05/13/2013 0430   O2SAT 96.3 05/13/2013 0430   CBG (last 3)   Recent Labs  05/12/13 1727 05/12/13 2049 05/13/13 0745  GLUCAP 216* 224* 154*    Assessment/Plan: S/P Procedure(s) (LRB): LEFT HEART CATHETERIZATION WITH CORONARY ANGIOGRAM (N/A)   CABG mon, March 2 preop anemia- patient understands he may need transfusion therapy in OR  LOS: 2 days    [name],PETER 05/13/2013

## 2013-05-13 NOTE — Progress Notes (Signed)
Subjective: No CP or SOB  Objective: Vital signs in last 24 hours: Temp:  [97.4 F (36.3 C)-98.4 F (36.9 C)] 97.6 F (36.4 C) (02/27 0427) Pulse Rate:  [78-121] 87 (02/26 2342) Resp:  [18] 18 (02/26 1633) BP: (96-138)/(67-80) 119/76 mmHg (02/27 0427) SpO2:  [91 %-97 %] 97 % (02/26 2342) Weight:  [207 lb 14.3 oz (94.3 kg)] 207 lb 14.3 oz (94.3 kg) (02/27 0427) Last BM Date: 05/12/13  Intake/Output from previous day: 02/26 0701 - 02/27 0700 In: 924.2 [P.O.:480; I.V.:444.2] Out: 1200 [Urine:1200] Intake/Output this shift:    Medications Current Facility-Administered Medications  Medication Dose Route Frequency Provider Last Rate Last Dose  . 0.9 %  sodium chloride infusion  250 mL Intravenous PRN Erlene Quan, PA-C      . acetaminophen (TYLENOL) tablet 650 mg  650 mg Oral Q4H PRN Erlene Quan, PA-C   650 mg at 05/13/13 3474  . ALPRAZolam Duanne Moron) tablet 0.25-0.5 mg  0.25-0.5 mg Oral Q4H PRN [name], MD      . aspirin EC tablet 81 mg  81 mg Oral Daily Luke K Kilroy, PA-C      . atorvastatin (LIPITOR) tablet 80 mg  80 mg Oral q1800 [name], MD   80 mg at 05/12/13 1829  . bisacodyl (DULCOLAX) EC tablet 5 mg  5 mg Oral Once [name], MD      . Derrill Memo ON 05/15/2013] bisacodyl (DULCOLAX) EC tablet 5 mg  5 mg Oral Once Leonie Man, MD      . Derrill Memo ON 05/15/2013] chlorhexidine (HIBICLENS) 4 % liquid 4 application  60 mL Topical Once Leonie Man, MD       And  . Derrill Memo ON 05/15/2013] chlorhexidine (HIBICLENS) 4 % liquid 4 application  60 mL Topical Once Leonie Man, MD      . Chlorhexidine Gluconate Cloth 2 % PADS 6 each  6 each Topical Daily Leonie Man, MD   6 each at 05/13/13 [phone]  . cycloSPORINE (SANDIMMUNE) capsule 100 mg  100 mg Oral Daily Erlene Quan, PA-C   100 mg at 05/12/13 1137  . [START ON 05/16/2013] diazepam (VALIUM) tablet 5 mg  5 mg Oral Once [name], MD      . glimepiride (AMARYL) tablet 4 mg  4 mg Oral Q breakfast Luke K  Kilroy, PA-C      . heparin ADULT infusion 100 units/mL (25000 units/250 mL)  1,800 Units/hr Intravenous Continuous Rogue Bussing, [facility] 18 mL/hr at 05/13/13 6387 1,800 Units/hr at 05/13/13 5643  . insulin aspart (novoLOG) injection 0-15 Units  0-15 Units Subcutaneous TID WC Erlene Quan, PA-C   5 Units at 05/12/13 1827  . insulin aspart (novoLOG) injection 0-5 Units  0-5 Units Subcutaneous QHS Doreene Burke [place], PA-C   2 Units at 05/12/13 2118  . lisinopril (PRINIVIL,ZESTRIL) tablet 10 mg  10 mg Oral Daily Luke K Kilroy, PA-C      . metoprolol tartrate (LOPRESSOR) tablet 12.5 mg  12.5 mg Oral BID Doreene Burke Kilroy, PA-C   12.5 mg at 05/12/13 2118  . metoprolol tartrate (LOPRESSOR) tablet 12.5 mg  12.5 mg Oral Once [name], MD      . mupirocin ointment (BACTROBAN) 2 % 1 application  1 application Nasal BID Leonie Man, MD   1 application at 32/95/18 0602  . nitroGLYCERIN (NITROGLYN) 2 % ointment 0.5 inch  0.5 inch Topical 4 times per  day Erlene Quan, PA-C   0.5 inch at 05/13/13 0601  . nitroGLYCERIN (NITROSTAT) SL tablet 0.4 mg  0.4 mg Sublingual Q5 Min x 3 PRN Doreene Burke Kilroy, PA-C      . ondansetron [facility]) injection 4 mg  4 mg Intravenous Q6H PRN Erlene Quan, PA-C      . pneumococcal 23 valent vaccine (PNU-IMMUNE) injection 0.5 mL  0.5 mL Intramuscular Tomorrow-1000 Leonie Man, MD      . sodium chloride 0.9 % injection 3 mL  3 mL Intravenous Q12H Erlene Quan, PA-C   3 mL at 05/11/13 2158  . sodium chloride 0.9 % injection 3 mL  3 mL Intravenous PRN Erlene Quan, PA-C      . [START ON 05/15/2013] temazepam (RESTORIL) capsule 15 mg  15 mg Oral Once PRN Leonie Man, MD      . zolpidem [facility]) tablet 5 mg  5 mg Oral QHS PRN Erlene Quan, PA-C   5 mg at 05/11/13 2157    PE: General appearance: alert, cooperative and no distress Lungs: Decreased BS at the bases.  No wheeze or rales. Heart: regular rate and rhythm, S1, S2 normal, no murmur, click, rub or gallop Abdomen: BS,  Soft nontender Extremities: Trace LEE Pulses: 2+ and symmetric Skin: Warm and dry.  radial cath site looks stable. Neurologic: Grossly normal  Lab Results:   Recent Labs  05/12/13 0043 05/13/13 0011  WBC 7.2 7.1  HGB 12.2* 11.5*  HCT 34.9* 33.4*  PLT 147* 151   BMET  Recent Labs  05/12/13 0043 05/13/13 0011  NA 135* 135*  K 4.3 4.2  CL 99 101  CO2 22 22  GLUCOSE 91 160*  BUN 31* 27*  CREATININE 1.12 1.07  CALCIUM 8.5 8.2*   PT/INR  Recent Labs  05/11/13 1929  LABPROT 14.7  INR 1.17   Cholesterol  Recent Labs  05/12/13 0043  CHOL 119   Lipid Panel     Component Value Date/Time   CHOL 119 05/12/2013 0043   TRIG 131 05/12/2013 0043   HDL 15* 05/12/2013 0043   CHOLHDL 7.9 05/12/2013 0043   VLDL 26 05/12/2013 0043   LDLCALC 78 05/12/2013 0043     Assessment/Plan   Principal Problem:   NSTEMI (non-ST elevated myocardial infarction) Active Problems:   Diabetes mellitus   Dyslipidemia   HTN (hypertension)   Rheumatoid arthritis   Plan:    S/P coronary angiography revealing:   Severe 3 vessel obstructive CAD    Moderate LV dysfunction  Consult by Dr. Prescott Gum.  CABG planned for Monday, March 2.  Echo: EF 50-55, mild LVH, mild hypokinesis of the entireinferior myocardium, diastolic dysfunction, moderate MR, moderate TR peak PA pressure 52mmHg.  BP and HR stable.   NSR on telemetry. SCr stable.  Troponin trending down.    ASA, heparin, lipitor, lisinopril 10, lopressor 12.5bid.  Glucose controlled for the most part.  Insulin, glimepiride, SS.  Transfer to tele.    LOS: 2 days    HAGER, BRYAN 05/13/2013 7:04 AM  I have examined the patient and reviewed assessment and plan and discussed with patient.  Agree with above as stated.  Patient stable post MI.    Awaiting CABG.  Radial site stable.  Questions answered.  Stormey Wilborn S.

## 2013-05-13 NOTE — Progress Notes (Signed)
ANTICOAGULATION CONSULT NOTE - Follow Up Consult  Pharmacy Consult for Heparin Indication: chest pain/ACS  No Known Allergies  Patient Measurements: Height: 5\' 8"  (172.7 cm) Weight: 207 lb 14.3 oz (94.3 kg) IBW/kg (Calculated) : 68.4 Heparin Dosing Weight: 87.5 kg  Vital Signs: Temp: 97.4 F (36.3 C) (02/27 1643) Temp src: Oral (02/27 1241) BP: 122/74 mmHg (02/27 1643) Pulse Rate: 101 (02/27 1441)  Labs:  Recent Labs  05/11/13 1929  05/12/13 0043  05/12/13 0702 05/13/13 0011 05/13/13 0748 05/13/13 1545  HGB  --   < > 12.2*  --   --  11.5* 11.3*  --   HCT  --   --  34.9*  --   --  33.4* 32.4*  --   PLT  --   --  147*  --   --  151 143*  --   LABPROT 14.7  --   --   --   --   --   --   --   INR 1.17  --   --   --   --   --   --   --   HEPARINUNFRC <0.10*  --   --   < >  --  <0.10* 0.10* 0.34  CREATININE  --   --  1.12  --   --  1.07  --   --   TROPONINI 4.62*  --  5.36*  --  4.92*  --   --   --   < > = values in this interval not displayed.  Estimated Creatinine Clearance: 72.6 ml/min (by C-G formula based on Cr of 1.07).  Assessment: 13 YOM transferred from [facility] with chest pain, s/p cath 2/26 with severe 3V CAD Plan for CABG on 3/2. Currently on IV heparin which is now at goal after rate adjustment to 2100 units/hr. No bleeding issues have been noted. Will follow up with level in am.  Goal of Therapy:  Heparin level 0.3-0.7 units/ml Monitor platelets by anticoagulation protocol: Yes   Plan:  - Continue haparin at 2100 units/hr  - daily heparin level and CBC - f/u plans for CABG  Erin Hearing PharmD., BCPS Clinical Pharmacist Pager [phone] 05/13/2013 5:24 PM

## 2013-05-13 NOTE — Progress Notes (Signed)
  SUBJECTIVE:  No CP  OBJECTIVE:   Vitals:   Filed Vitals:   05/12/13 1947 05/12/13 2118 05/12/13 2342 05/13/13 0427  BP: 121/72 138/78 113/73 119/76  Pulse:  87 87   Temp: 98.4 F (36.9 C)  98.1 F (36.7 C) 97.6 F (36.4 C)  TempSrc: Oral  Oral Oral  Resp:      Height:      Weight:    207 lb 14.3 oz (94.3 kg)  SpO2: 97%  97%    I&O's:   Intake/Output Summary (Last 24 hours) at 05/13/13 7846 Last data filed at 05/12/13 1730  Gross per 24 hour  Intake 924.17 ml  Output   1200 ml  Net -275.83 ml   TELEMETRY: Reviewed telemetry pt in NSR:     PHYSICAL EXAM General: Well developed, well nourished, in no acute distress Head: Normal cephalic and atramatic  Lungs:   Clear bilaterally to auscultation and percussion. Heart:   HRRR S1 S2, No JVD.  No abdominal bruits. No femoral bruits. Abdomen: abdomen soft and non-tender Msk:  Normal strength and tone for age.  Joint deformities. Extremities:  no edema.  DP +1 Neuro: Alert and oriented X 3. Psych:  Good affect, responds appropriately   LABS: Basic Metabolic Panel:  Recent Labs  05/12/13 0043 05/13/13 0011  NA 135* 135*  K 4.3 4.2  CL 99 101  CO2 22 22  GLUCOSE 91 160*  BUN 31* 27*  CREATININE 1.12 1.07  CALCIUM 8.5 8.2*   Liver Function Tests:  Recent Labs  05/13/13 0011  AST 25  ALT 16  ALKPHOS 82  BILITOT 0.5  PROT 6.9  ALBUMIN 2.6*   No results found for this basename: LIPASE, AMYLASE,  in the last 72 hours CBC:  Recent Labs  05/12/13 0043 05/13/13 0011  WBC 7.2 7.1  HGB 12.2* 11.5*  HCT 34.9* 33.4*  MCV 92.6 93.0  PLT 147* 151   Cardiac Enzymes:  Recent Labs  05/11/13 1929 05/12/13 0043 05/12/13 0702  TROPONINI 4.62* 5.36* 4.92*   BNP: No components found with this basename: POCBNP,  D-Dimer: No results found for this basename: DDIMER,  in the last 72 hours Hemoglobin A1C: No results found for this basename: HGBA1C,  in the last 72 hours Fasting Lipid Panel:  Recent  Labs  05/12/13 0043  CHOL 119  HDL 15*  LDLCALC 78  TRIG 131  CHOLHDL 7.9   Thyroid Function Tests:  Recent Labs  05/11/13 1929  TSH 4.659*   Anemia Panel: No results found for this basename: VITAMINB12, FOLATE, FERRITIN, TIBC, IRON, RETICCTPCT,  in the last 72 hours Coag Panel:   Lab Results  Component Value Date   INR 1.17 05/11/2013    RADIOLOGY: No results found.    ASSESSMENT: CAD  PLAN:  Await CABG Monday. Continue current meds.  Wean oxygen. Transfer to tele.  Jettie Booze., MD  05/13/2013  7:29 AM

## 2013-05-13 NOTE — Progress Notes (Signed)
CARDIAC REHAB PHASE I   PRE:  Rate/Rhythm: 80SR    BP: sitting 126/83    SaO2: 97 2L, 95 RA  MODE:  Ambulation: 500 ft   POST:  Rate/Rhythm: 111 ST    BP: sitting 140/105, recheck 5 min later 140/75     SaO2: 90-91 RA   Pt fairly steady, no assist needed with slow, cautious pace. Felt well walking but after sitting c/o SOB and slight CP. SaO2 low and BP/HR elevated. Reapplied 2L O2 and CP resolved after 3-4 min. Recommended pt walking with O2 next walk and less than 500 ft. Discussed OHS, sternal precautions, mobility post-op. Gave OHS guideline, pt and wife have seen video. Have booklet they will read through.  [phone] Josephina Shih [place] CES, ACSM 05/13/2013 10:31 AM

## 2013-05-14 LAB — HEPARIN LEVEL (UNFRACTIONATED)
HEPARIN UNFRACTIONATED: 0.29 [IU]/mL — AB (ref 0.30–0.70)
Heparin Unfractionated: 0.48 IU/mL (ref 0.30–0.70)

## 2013-05-14 LAB — URINE CULTURE: Colony Count: 25000

## 2013-05-14 LAB — BASIC METABOLIC PANEL
BUN: 22 mg/dL (ref 6–23)
CALCIUM: 8.7 mg/dL (ref 8.4–10.5)
CO2: 22 mEq/L (ref 19–32)
Chloride: 102 mEq/L (ref 96–112)
Creatinine, Ser: 1.1 mg/dL (ref 0.50–1.35)
GFR calc Af Amer: 77 mL/min — ABNORMAL LOW (ref >90)
GFR calc non Af Amer: 67 mL/min — ABNORMAL LOW (ref >90)
GLUCOSE: 119 mg/dL — AB (ref 70–99)
Potassium: 4.4 mEq/L (ref 3.7–5.3)
Sodium: 141 mEq/L (ref 137–147)

## 2013-05-14 LAB — GLUCOSE, CAPILLARY
GLUCOSE-CAPILLARY: 122 mg/dL — AB (ref 70–99)
Glucose-Capillary: 102 mg/dL — ABNORMAL HIGH (ref 70–99)
Glucose-Capillary: 158 mg/dL — ABNORMAL HIGH (ref 70–99)
Glucose-Capillary: 117 mg/dL — ABNORMAL HIGH (ref 70–99)

## 2013-05-14 LAB — CBC
HCT: 32.3 % — ABNORMAL LOW (ref 39.0–52.0)
HEMOGLOBIN: 10.9 g/dL — AB (ref 13.0–17.0)
MCH: 31.8 pg (ref 26.0–34.0)
MCHC: 33.7 g/dL (ref 30.0–36.0)
MCV: 94.2 fL (ref 78.0–100.0)
PLATELETS: 155 10*3/uL (ref 150–400)
RBC: 3.43 MIL/uL — ABNORMAL LOW (ref 4.22–5.81)
RDW: 15.2 % (ref 11.5–15.5)
WBC: 5.7 10*3/uL (ref 4.0–10.5)

## 2013-05-14 LAB — APTT: APTT: 54 s — AB (ref 24–37)

## 2013-05-14 LAB — MAGNESIUM: Magnesium: 2.1 mg/dL (ref 1.5–2.5)

## 2013-05-14 NOTE — Progress Notes (Signed)
ANTICOAGULATION CONSULT NOTE - Follow Up Consult  Pharmacy Consult for heparin Indication: CAD awaiting CABG  Labs:  Recent Labs  05/11/13 1929  05/12/13 0043  05/12/13 0702 05/13/13 0011 05/13/13 0748 05/13/13 1545 05/14/13 0512  HGB  --   < > 12.2*  --   --  11.5* 11.3*  --  10.9*  HCT  --   < > 34.9*  --   --  33.4* 32.4*  --  32.3*  PLT  --   < > 147*  --   --  151 143*  --  155  APTT  --   --   --   --   --   --   --   --  54*  LABPROT 14.7  --   --   --   --   --   --   --   --   INR 1.17  --   --   --   --   --   --   --   --   HEPARINUNFRC <0.10*  --   --   < >  --  <0.10* 0.10* 0.34 0.29*  CREATININE  --   --  1.12  --   --  1.07  --   --   --   TROPONINI 4.62*  --  5.36*  --  4.92*  --   --   --   --   < > = values in this interval not displayed.   Assessment: 68yo male now slightly subtherapeutic on heparin after one level at low end of goal.  Goal of Therapy:  Heparin level 0.3-0.7 units/ml   Plan:  Will increase heparin gtt slightly to 2200 units/hr and check level in 6hr.  Wynona Neat, PharmD, BCPS  05/14/2013,6:54 AM

## 2013-05-14 NOTE — Progress Notes (Signed)
CARDIAC REHAB PHASE I   PRE:  Rate/Rhythm: 85 SR  BP:  Sitting: 120/80     SaO2: 92% RA  MODE:  Ambulation: 150 ft   POST:  Rate/Rhythm: 90 SR  BP:  Sitting: 124/80     SaO2: 93% RA  11:25AM-11:43AM Patient walked with x1 assist without walker.  He walked at a slow and unsteady gait. I monitored his oxygen saturation throughout the walk and it did not drop below 91%.   After the walk he stated he was sob.  Patient was left sitting at bedside with wife.  [facility], [place] 05/14/2013 11:40 AM

## 2013-05-14 NOTE — Progress Notes (Signed)
ANTICOAGULATION CONSULT NOTE - Follow Up Consult  Pharmacy Consult for Heparin Indication: CAD awaiting CABG  No Known Allergies  Patient Measurements: Height: 5\' 8"  (172.7 cm) Weight: 207 lb 14.3 oz (94.3 kg) IBW/kg (Calculated) : 68.4 Heparin Dosing Weight: 87.5kg  Vital Signs: Temp: 98.2 F (36.8 C) (02/28 0850) Temp src: Oral (02/28 0850) BP: 128/86 mmHg (02/28 0850)  Labs:  Recent Labs  05/11/13 1929  05/12/13 0043  05/12/13 7564 05/13/13 0011 05/13/13 0748 05/13/13 1545 05/14/13 0512 05/14/13 1300  HGB  --   < > 12.2*  --   --  11.5* 11.3*  --  10.9*  --   HCT  --   < > 34.9*  --   --  33.4* 32.4*  --  32.3*  --   PLT  --   < > 147*  --   --  151 143*  --  155  --   APTT  --   --   --   --   --   --   --   --  54*  --   LABPROT 14.7  --   --   --   --   --   --   --   --   --   INR 1.17  --   --   --   --   --   --   --   --   --   HEPARINUNFRC <0.10*  --   --   < >  --  <0.10* 0.10* 0.34 0.29* 0.48  CREATININE  --   --  1.12  --   --  1.07  --   --   --   --   TROPONINI 4.62*  --  5.36*  --  4.92*  --   --   --   --   --   < > = values in this interval not displayed.  Estimated Creatinine Clearance: 72.6 ml/min (by C-G formula based on Cr of 1.07).   Medications:  Heparin @ 2200 units/hr  Assessment: 67yom continues on heparin for severe 3v CAD awaiting CABG on Monday. Heparin level is at goal. CBC is stable. No bleeding reported.  Goal of Therapy:  Heparin level 0.3-0.7 units/ml Monitor platelets by anticoagulation protocol: Yes   Plan:  1) Continue heparin at 2200 units/hr 2) Follow up heparin level, CBC in AM  Fatina Sprankle, Benjamine Sprague 05/14/2013,1:57 PM

## 2013-05-14 NOTE — Progress Notes (Signed)
Pt had 6 beat tun of Vtach. Pt was resting in bed, asymptomatic. Dr. Saunders Revel notified, orders to check electrolytes placed.

## 2013-05-14 NOTE — Progress Notes (Signed)
Patient: Francis Wilkinson Date of Encounter: 05/14/2013, 7:36 AM Admit date: 05/11/2013    Primary cardiologist: Dr. Bryan Lemma   Subjective  Francis Wilkinson has no complaints this AM. He denies CP or SOB. Awaiting CABG on Monday, March 2.   Objective  Physical Exam: Vitals: BP 142/85  Pulse 83  Temp(Src) 98.3 F (36.8 C) (Oral)  Resp 18  Ht 5\' 8"  (1.727 m)  Wt 207 lb 14.3 oz (94.3 kg)  BMI 31.62 kg/m2  SpO2 93% General: Well developed, well appearing 68 year old male in no acute distress. Neck: Supple. JVD not elevated. Lungs: Clear bilaterally to auscultation without wheezes, rales, or rhonchi. Breathing is unlabored. Heart: RRR S1 S2 without murmurs, rubs, or gallops.  Abdomen: Soft, non-distended. Extremities: No clubbing or cyanosis. No edema.  Distal pedal pulses are 2+ and equal bilaterally. Neuro: Alert and oriented X 3. Moves all extremities spontaneously. No focal deficits.  Intake/Output:  Intake/Output Summary (Last 24 hours) at 05/14/13 0736 Last data filed at 05/14/13 0600  Gross per 24 hour  Intake   1719 ml  Output    300 ml  Net   1419 ml    Inpatient Medications:  . aspirin EC  81 mg Oral Daily  . atorvastatin  80 mg Oral q1800  . bisacodyl  5 mg Oral Once  . [START ON 05/15/2013] bisacodyl  5 mg Oral Once  . [START ON 05/15/2013] chlorhexidine  60 mL Topical Once   And  . [START ON 05/15/2013] chlorhexidine  60 mL Topical Once  . Chlorhexidine Gluconate Cloth  6 each Topical Daily  . cycloSPORINE  100 mg Oral Daily  . [START ON 05/16/2013] diazepam  5 mg Oral Once  . glimepiride  4 mg Oral Q breakfast  . insulin aspart  0-15 Units Subcutaneous TID WC  . insulin aspart  0-5 Units Subcutaneous QHS  . lisinopril  10 mg Oral Daily  . metoprolol tartrate  12.5 mg Oral BID  . metoprolol tartrate  12.5 mg Oral Once  . mupirocin ointment  1 application Nasal BID  . nitroGLYCERIN  0.5 inch Topical 4 times per day  . pneumococcal 23 valent vaccine  0.5 mL  Intramuscular Tomorrow-1000   . heparin 2,200 Units/hr (05/14/13 0658)    Labs:  Recent Labs  05/12/13 0043 05/13/13 0011  NA 135* 135*  K 4.3 4.2  CL 99 101  CO2 22 22  GLUCOSE 91 160*  BUN 31* 27*  CREATININE 1.12 1.07  CALCIUM 8.5 8.2*    Recent Labs  05/13/13 0011  AST 25  ALT 16  ALKPHOS 82  BILITOT 0.5  PROT 6.9  ALBUMIN 2.6*    Recent Labs  05/13/13 0748 05/14/13 0512  WBC 6.3 5.7  HGB 11.3* 10.9*  HCT 32.4* 32.3*  MCV 93.6 94.2  PLT 143* 155    Recent Labs  05/11/13 1929 05/12/13 0043 05/12/13 0702  TROPONINI 4.62* 5.36* 4.92*    Recent Labs  05/13/13 0011  HGBA1C 8.9*    Recent Labs  05/12/13 0043  CHOL 119  HDL 15*  LDLCALC 78  TRIG 628  CHOLHDL 7.9    Recent Labs  05/11/13 1929  TSH 4.659*    Recent Labs  05/11/13 1929  INR 1.17    Radiology/Studies: Dg Chest 2 View 05/13/2013    FINDINGS: Stable enlarged cardiac and mediastinal contours. Minimal bibasilar linear opacities. Small bilateral pleural effusions. No pneumothorax. Eventration of the right hemidiaphragm. Old left  rib fractures.   IMPRESSION: Small bilateral pleural effusions with minimal bibasilar atelectasis.    Electronically Signed   By: Lovey Newcomer M.D.   On: 05/13/2013 08:37   Ct Soft Tissue Neck Wo Contrast 05/13/2013   FINDINGS: Layering pleural effusions, dependent atelectasis and respiratory motion in the upper lungs. No superior mediastinal lymphadenopathy.  Negative non contrast thyroid, larynx, pharynx (mild lingual tonsil hypertrophy), parapharyngeal spaces, retropharyngeal space, sublingual space, submandibular glands (diminutive), and parotid glands.  Grossly negative visualized brain parenchyma. Calcified atherosclerosis in the neck and at the skullbase. No cervical lymphadenopathy identified.  Left maxillary sinus mucous retention cyst. Other Visualized paranasal sinuses and mastoids are clear. Some right-sided poor dentition (sagittal images  47, 44). Facet degeneration in the right cervical spine. No acute osseous abnormality identified.  No focal inflammatory change identified in the neck.   IMPRESSION: 1. Negative non contrast CT appearance of the neck soft tissues. 2. Some poor dentition on the right. 3. Pleural effusions.    Electronically Signed   By: Lars Pinks M.D.   On: 05/13/2013 08:36   Ct Chest Wo Contrast 05/13/2013    FINDINGS: Minimal synechiae or retained secretions in the trachea. Major airways are patent.  Small to moderate bilateral layering pleural effusions. Compressive atelectasis. Superimposed perihilar and dependent ground-glass opacity with some septal thickening.  No pericardial effusion. Extensive calcified coronary atherosclerosis. No hilar or mediastinal lymphadenopathy identified. No axillary lymphadenopathy.  Negative visualized non contrast liver, spleen, and upper abdominal bowel.  Chronic left lateral rib fractures. No acute osseous abnormality identified.   IMPRESSION: 1. Small to moderate layering bilateral pleural effusions with superimposed ground-glass opacity in septal thickening suggesting a degree of pulmonary edema. 2. Extensive calcified coronary atherosclerosis.    Electronically Signed   By: Lars Pinks M.D.   On: 05/13/2013 08:39   Cardiac catheterization Procedural Findings:  Hemodynamics:  AO 101/66 mean 83 mm Hg  LV 102/27 mm Hg  Coronary angiography:  Coronary dominance: right  Left mainstem: Mild irregularities.  Left anterior descending (LAD): Severely calcified proximally. Diffuse 30% proximal. There is a 95% stenosis in the mid vessel after a small diagonal.  Left circumflex (LCx): 70% proximal. The first OM is occluded with faint collateral flow. The second OM is without significant disease.  Right coronary artery (RCA): The RCA is a large ectatic vessel. It is severely calcified throughout. There is segmental 40% disease in the proximal to mid vessel with a 60-70% lesion at the  crux. The PDA has 60-70% disease proximally. The second PLOM branch is occluded and fills by left to right collaterals.  Left ventriculography: Left ventricular systolic function is abnormal, there is moderate to severe anterior hypokinesis, LVEF is estimated at 40-45%%, there is no significant mitral regurgitation  Final Conclusions:  1. Severe 3 vessel obstructive CAD  2. Moderate LV dysfunction.   Echocardiogram Study Conclusions - Left ventricle: The cavity size was normal. Wall thickness was increased in a pattern of mild LVH. Systolic function was normal. The estimated ejection fraction was in the range of 50% to 55%. There is mild hypokinesis of the entireinferior myocardium. Doppler parameters are consistent with restrictive physiology, indicative of decreased left ventricular diastolic compliance and/or increased left atrial pressure. - Mitral valve: Moderate regurgitation. - Left atrium: The atrium was mildly dilated. - Tricuspid valve: Moderate regurgitation. - Pulmonary arteries: PA peak pressure: 70mm Hg (S).  Telemetry: SR; no arrhythmias   Assessment and Plan   1. NSTEMI with severe 3-vessel CAD  s/p cardiac cath 05/12/2013 - troponin trended down - no recurrent CP - continue IV heparin, ASA, BB, ACEI and statin - transfer to telemetry - awaiting CABG on Monday, March 2  Signed, Ileene Hutchinson PA-C   Attending note:  Patient seen and examined. Discussed with Ms. Edmisten PA-C and reviewed above note. Mr. Krach has been clinically stable without recurrent angina, recent NSTEMI with documentation of severe multivessel CAD. He is pending CABG on Monday. Plan will be transfer out to the telemetry unit on medical therapy which includes IV heparin.  Satira Sark, M.D., F.A.C.C.

## 2013-05-15 LAB — CBC
HEMATOCRIT: 32.7 % — AB (ref 39.0–52.0)
Hemoglobin: 11.1 g/dL — ABNORMAL LOW (ref 13.0–17.0)
MCH: 32 pg (ref 26.0–34.0)
MCHC: 33.9 g/dL (ref 30.0–36.0)
MCV: 94.2 fL (ref 78.0–100.0)
Platelets: 162 10*3/uL (ref 150–400)
RBC: 3.47 MIL/uL — ABNORMAL LOW (ref 4.22–5.81)
RDW: 15.3 % (ref 11.5–15.5)
WBC: 5.9 10*3/uL (ref 4.0–10.5)

## 2013-05-15 LAB — HEPARIN LEVEL (UNFRACTIONATED): Heparin Unfractionated: 0.58 IU/mL (ref 0.30–0.70)

## 2013-05-15 LAB — GLUCOSE, CAPILLARY
GLUCOSE-CAPILLARY: 123 mg/dL — AB (ref 70–99)
GLUCOSE-CAPILLARY: 142 mg/dL — AB (ref 70–99)
Glucose-Capillary: 84 mg/dL (ref 70–99)
Glucose-Capillary: 201 mg/dL — ABNORMAL HIGH (ref 70–99)

## 2013-05-15 MED ORDER — PHENYLEPHRINE HCL 10 MG/ML IJ SOLN
30.0000 ug/min | INTRAVENOUS | Status: DC
  Administered 2013-05-16: 13 ug/min via INTRAVENOUS
  Filled 2013-05-15: qty 2

## 2013-05-15 MED ORDER — CHLORHEXIDINE GLUCONATE 4 % EX LIQD
60.0000 mL | Freq: Once | CUTANEOUS | Status: AC
  Administered 2013-05-15: 4 via TOPICAL
  Filled 2013-05-15: qty 60

## 2013-05-15 MED ORDER — DEXMEDETOMIDINE HCL IN NACL 400 MCG/100ML IV SOLN
0.1000 ug/kg/h | INTRAVENOUS | Status: AC
  Administered 2013-05-16: 0.4 ug/kg/h via INTRAVENOUS
  Filled 2013-05-15: qty 100

## 2013-05-15 MED ORDER — DEXTROSE 5 % IV SOLN
750.0000 mg | INTRAVENOUS | Status: DC
  Filled 2013-05-15: qty 750

## 2013-05-15 MED ORDER — MAGNESIUM SULFATE 50 % IJ SOLN
40.0000 meq | INTRAMUSCULAR | Status: DC
  Filled 2013-05-15: qty 10

## 2013-05-15 MED ORDER — DIAZEPAM 5 MG PO TABS: 5.0000 mg | ORAL_TABLET | ORAL | Status: DC | PRN

## 2013-05-15 MED ORDER — EPINEPHRINE HCL 1 MG/ML IJ SOLN
0.5000 ug/min | INTRAVENOUS | Status: DC
  Filled 2013-05-15: qty 4

## 2013-05-15 MED ORDER — TEMAZEPAM 15 MG PO CAPS: 15.0000 mg | ORAL_CAPSULE | Freq: Once | ORAL | Status: AC | PRN

## 2013-05-15 MED ORDER — VANCOMYCIN HCL 10 G IV SOLR
1500.0000 mg | INTRAVENOUS | Status: AC
  Administered 2013-05-16: 1500 mg via INTRAVENOUS
  Filled 2013-05-15: qty 1500

## 2013-05-15 MED ORDER — METOPROLOL TARTRATE 25 MG PO TABS
25.0000 mg | ORAL_TABLET | Freq: Two times a day (BID) | ORAL | Status: DC
  Administered 2013-05-15 (×2): 25 mg via ORAL
  Filled 2013-05-15 (×4): qty 1

## 2013-05-15 MED ORDER — DEXTROSE 5 % IV SOLN
1.5000 g | INTRAVENOUS | Status: AC
  Administered 2013-05-16: .75 g via INTRAVENOUS
  Administered 2013-05-16: 1.5 g via INTRAVENOUS
  Filled 2013-05-15: qty 1.5

## 2013-05-15 MED ORDER — METOPROLOL TARTRATE 12.5 MG HALF TABLET
12.5000 mg | ORAL_TABLET | Freq: Once | ORAL | Status: AC
  Administered 2013-05-16: 12.5 mg via ORAL
  Filled 2013-05-15: qty 1

## 2013-05-15 MED ORDER — DOPAMINE-DEXTROSE 3.2-5 MG/ML-% IV SOLN
2.0000 ug/kg/min | INTRAVENOUS | Status: DC
  Filled 2013-05-15: qty 250

## 2013-05-15 MED ORDER — POTASSIUM CHLORIDE 2 MEQ/ML IV SOLN
80.0000 meq | INTRAVENOUS | Status: DC
  Filled 2013-05-15: qty 40

## 2013-05-15 MED ORDER — NITROGLYCERIN IN D5W 200-5 MCG/ML-% IV SOLN
2.0000 ug/min | INTRAVENOUS | Status: DC
  Filled 2013-05-15: qty 250

## 2013-05-15 MED ORDER — SODIUM CHLORIDE 0.9 % IV SOLN
INTRAVENOUS | Status: DC
  Filled 2013-05-15: qty 30

## 2013-05-15 MED ORDER — CHLORHEXIDINE GLUCONATE 4 % EX LIQD
60.0000 mL | Freq: Once | CUTANEOUS | Status: AC
  Administered 2013-05-16: 4 via TOPICAL
  Filled 2013-05-15: qty 60

## 2013-05-15 MED ORDER — INSULIN REGULAR HUMAN 100 UNIT/ML IJ SOLN
INTRAMUSCULAR | Status: AC
  Administered 2013-05-16: 1 [IU]/h via INTRAVENOUS
  Filled 2013-05-15: qty 1

## 2013-05-15 MED ORDER — SODIUM CHLORIDE 0.9 % IV SOLN
INTRAVENOUS | Status: AC
  Administered 2013-05-16: 69 mL/h via INTRAVENOUS
  Filled 2013-05-15: qty 40

## 2013-05-15 MED ORDER — PLASMA-LYTE 148 IV SOLN
INTRAVENOUS | Status: AC
  Administered 2013-05-16: 08:00:00
  Filled 2013-05-15: qty 2.5

## 2013-05-15 NOTE — Progress Notes (Signed)
ANTICOAGULATION CONSULT NOTE - Follow Up Consult  Pharmacy Consult for Heparin Indication: CAD awaiting CABG  No Known Allergies  Patient Measurements: Height: 5\' 8"  (172.7 cm) Weight: 207 lb 14.3 oz (94.3 kg) IBW/kg (Calculated) : 68.4 Heparin Dosing Weight: 87.5kg  Vital Signs: Temp: 98.8 F (37.1 C) (03/01 0511) Temp src: Oral (03/01 0511) BP: 124/82 mmHg (03/01 0511) Pulse Rate: 74 (03/01 0511)  Labs:  Recent Labs  05/13/13 0011 05/13/13 0748  05/14/13 0512 05/14/13 1300 05/14/13 2034 05/15/13 0410  HGB 11.5* 11.3*  --  10.9*  --   --  11.1*  HCT 33.4* 32.4*  --  32.3*  --   --  32.7*  PLT 151 143*  --  155  --   --  162  APTT  --   --   --  54*  --   --   --   HEPARINUNFRC <0.10* 0.10*  < > 0.29* 0.48  --  0.58  CREATININE 1.07  --   --   --   --  1.10  --   < > = values in this interval not displayed.  Estimated Creatinine Clearance: 70.6 ml/min (by C-G formula based on Cr of 1.1).   Medications:  Heparin @ 2200 units/hr  Assessment: 67yom continues on heparin for severe 3v CAD awaiting CABG tomorrow. Heparin level is at goal. CBC is stable. No bleeding reported.  Goal of Therapy:  Heparin level 0.3-0.7 units/ml Monitor platelets by anticoagulation protocol: Yes   Plan:  1) Continue heparin at 2200 units/hr 2) Follow up after CABG  Deboraha Sprang 05/15/2013,10:43 AM

## 2013-05-15 NOTE — Progress Notes (Signed)
    Primary cardiologist: Dr. Glenetta Hew  Subjective:    No chest pain or shortness of breath at rest. No dizziness.  Objective:   Temp:  [98.2 F (36.8 C)-99.5 F (37.5 C)] 98.8 F (37.1 C) (03/01 0511) Pulse Rate:  [74-83] 74 (03/01 0511) Resp:  [18] 18 (03/01 0511) BP: (124-131)/(77-86) 124/82 mmHg (03/01 0511) SpO2:  [90 %-96 %] 93 % (03/01 0511) Last BM Date: 05/14/13  Filed Weights   05/12/13 0334 05/13/13 0427 05/14/13 0615  Weight: 203 lb 0.7 oz (92.1 kg) 207 lb 14.3 oz (94.3 kg) 207 lb 14.3 oz (94.3 kg)    Intake/Output Summary (Last 24 hours) at 05/15/13 0801 Last data filed at 05/15/13 0600  Gross per 24 hour  Intake    942 ml  Output      0 ml  Net    942 ml    Telemetry: Sinus rhythm, ventricular couplet noted.  Exam:  General: No distress.  Lungs: Clear, nonlabored.  Cardiac: RRR, soft apical systolic murmur.  Extremities: No pitting.   Lab Results:  Basic Metabolic Panel:  Recent Labs Lab 05/12/13 0043 05/13/13 0011 05/14/13 2034  NA 135* 135* 141  K 4.3 4.2 4.4  CL 99 101 102  CO2 22 22 22   GLUCOSE 91 160* 119*  BUN 31* 27* 22  CREATININE 1.12 1.07 1.10  CALCIUM 8.5 8.2* 8.7  MG  --   --  2.1    Liver Function Tests:  Recent Labs Lab 05/13/13 0011  AST 25  ALT 16  ALKPHOS 82  BILITOT 0.5  PROT 6.9  ALBUMIN 2.6*    CBC:  Recent Labs Lab 05/13/13 0748 05/14/13 0512 05/15/13 0410  WBC 6.3 5.7 5.9  HGB 11.3* 10.9* 11.1*  HCT 32.4* 32.3* 32.7*  MCV 93.6 94.2 94.2  PLT 143* 155 162    Cardiac Enzymes:  Recent Labs Lab 05/11/13 1929 05/12/13 0043 05/12/13 0702  TROPONINI 4.62* 5.36* 4.92*    Medications:   Scheduled Medications: . aspirin EC  81 mg Oral Daily  . atorvastatin  80 mg Oral q1800  . Chlorhexidine Gluconate Cloth  6 each Topical Daily  . cycloSPORINE  100 mg Oral Daily  . glimepiride  4 mg Oral Q breakfast  . insulin aspart  0-15 Units Subcutaneous TID WC  . insulin aspart  0-5 Units  Subcutaneous QHS  . lisinopril  10 mg Oral Daily  . metoprolol tartrate  12.5 mg Oral BID  . mupirocin ointment  1 application Nasal BID  . nitroGLYCERIN  0.5 inch Topical 4 times per day  . pneumococcal 23 valent vaccine  0.5 mL Intramuscular Tomorrow-1000     Infusions: . heparin 2,200 Units/hr (05/15/13 0458)     PRN Medications:  acetaminophen, ALPRAZolam, nitroGLYCERIN, ondansetron (ZOFRAN) IV, zolpidem   Assessment:   1. NSTEMI. Continues on heparin.  2. Multivessel CAD, LVEF 50-55% with mild inferior hypokinesis by echocardiogram. Plan is for CABG on Monday.  3. Moderate mitral regurgitation.  4. Hypertension.  5. Type 2 diabetes mellitus.   Plan/Discussion:    Continue aspirin, heparin, Lipitor, lisinopril, increase Lopressor 25 mg twice daily. Plan is for CABG tomorrow.   Satira Sark, M.D., F.A.C.C.

## 2013-05-15 NOTE — Progress Notes (Signed)
Pt fully recovered from episode of chest pain. Much calmer after alprazolam.   Lungs clear. Pt resting comfortably.

## 2013-05-15 NOTE — Progress Notes (Signed)
Pt with episode of midsternal chest pain 4/10, sob, anxious, diaphoretic, moist wheezes throughout lungs. BP 168/102, HR 120. SL NTG X1 given with improvement in resp, CP improved to 2/10. Second SL NTG given with resolution of CP. Lungs still with moist wheezes. Pt calming and skin dry. Dr. Claiborne Billings and D.r [facility] notified. Will give Alprazolam for anxiety.

## 2013-05-16 ENCOUNTER — Inpatient Hospital Stay (HOSPITAL_COMMUNITY): Payer: Medicare Other

## 2013-05-16 ENCOUNTER — Encounter (HOSPITAL_COMMUNITY): Payer: Self-pay | Admitting: Anesthesiology

## 2013-05-16 ENCOUNTER — Encounter (HOSPITAL_COMMUNITY): Payer: Medicare Other | Admitting: Anesthesiology

## 2013-05-16 ENCOUNTER — Inpatient Hospital Stay (HOSPITAL_COMMUNITY): Payer: Medicare Other | Admitting: Anesthesiology

## 2013-05-16 ENCOUNTER — Encounter (HOSPITAL_COMMUNITY)
Admission: AD | Disposition: A | Discharge status: Home / Self Care | Payer: Medicare Other | Source: Other Acute Inpatient Hospital | Attending: Cardiothoracic Surgery

## 2013-05-16 DIAGNOSIS — I270 Primary pulmonary hypertension: Secondary | ICD-10-CM

## 2013-05-16 DIAGNOSIS — M069 Rheumatoid arthritis, unspecified: Secondary | ICD-10-CM

## 2013-05-16 DIAGNOSIS — I2510 Atherosclerotic heart disease of native coronary artery without angina pectoris: Secondary | ICD-10-CM

## 2013-05-16 HISTORY — PX: CORONARY ARTERY BYPASS GRAFT: SHX141

## 2013-05-16 HISTORY — PX: INTRAOPERATIVE TRANSESOPHAGEAL ECHOCARDIOGRAM: SHX5062

## 2013-05-16 LAB — BLOOD GAS, ARTERIAL
Acid-base deficit: 1.7 mmol/L (ref 0.0–2.0)
Bicarbonate: 23.8 mEq/L (ref 20.0–24.0)
Drawn by: 249101
FIO2: 0.6 %
LHR: 12 {breaths}/min
NITRIC OXIDE: 20
O2 Saturation: 96.1 %
PATIENT TEMPERATURE: 98.6
PEEP: 5 cmH2O
Pressure support: 10 cmH2O
TCO2: 25.3 mmol/L (ref 0–100)
VT: 680 mL
pCO2 arterial: 49.6 mmHg — ABNORMAL HIGH (ref 35.0–45.0)
pH, Arterial: 7.302 — ABNORMAL LOW (ref 7.350–7.450)
pO2, Arterial: 85 mmHg (ref 80.0–100.0)

## 2013-05-16 LAB — POCT I-STAT 3, ART BLOOD GAS (G3+)
ACID-BASE DEFICIT: 1 mmol/L (ref 0.0–2.0)
Acid-base deficit: 2 mmol/L (ref 0.0–2.0)
Acid-base deficit: 1 mmol/L (ref 0.0–2.0)
BICARBONATE: 23.6 meq/L (ref 20.0–24.0)
Bicarbonate: 26.4 mEq/L — ABNORMAL HIGH (ref 20.0–24.0)
Bicarbonate: 25.1 mEq/L — ABNORMAL HIGH (ref 20.0–24.0)
Bicarbonate: 25.7 mEq/L — ABNORMAL HIGH (ref 20.0–24.0)
O2 SAT: 100 %
O2 Saturation: 100 %
O2 Saturation: 100 %
O2 Saturation: 94 %
PCO2 ART: 49.5 mmHg — AB (ref 35.0–45.0)
PCO2 ART: 44.8 mmHg (ref 35.0–45.0)
PH ART: 7.334 — AB (ref 7.350–7.450)
PH ART: 7.329 — AB (ref 7.350–7.450)
PH ART: 7.331 — AB (ref 7.350–7.450)
PO2 ART: 340 mmHg — AB (ref 80.0–100.0)
TCO2: 28 mmol/L (ref 0–100)
TCO2: 27 mmol/L (ref 0–100)
TCO2: 25 mmol/L (ref 0–100)
TCO2: 27 mmol/L (ref 0–100)
pCO2 arterial: 49.5 mmHg — ABNORMAL HIGH (ref 35.0–45.0)
pCO2 arterial: 47.9 mmHg — ABNORMAL HIGH (ref 35.0–45.0)
pH, Arterial: 7.312 — ABNORMAL LOW (ref 7.350–7.450)
pO2, Arterial: 374 mmHg — ABNORMAL HIGH (ref 80.0–100.0)
pO2, Arterial: 189 mmHg — ABNORMAL HIGH (ref 80.0–100.0)
pO2, Arterial: 72 mmHg — ABNORMAL LOW (ref 80.0–100.0)

## 2013-05-16 LAB — POCT I-STAT, CHEM 8
BUN: 15 mg/dL (ref 6–23)
CREATININE: 0.9 mg/dL (ref 0.50–1.35)
Calcium, Ion: 1.12 mmol/L — ABNORMAL LOW (ref 1.13–1.30)
Chloride: 104 mEq/L (ref 96–112)
Glucose, Bld: 117 mg/dL — ABNORMAL HIGH (ref 70–99)
HEMATOCRIT: 31 % — AB (ref 39.0–52.0)
HEMOGLOBIN: 10.5 g/dL — AB (ref 13.0–17.0)
POTASSIUM: 4.3 meq/L (ref 3.7–5.3)
Sodium: 139 mEq/L (ref 137–147)
TCO2: 21 mmol/L (ref 0–100)

## 2013-05-16 LAB — POCT I-STAT 4, (NA,K, GLUC, HGB,HCT)
GLUCOSE: 93 mg/dL (ref 70–99)
Glucose, Bld: 94 mg/dL (ref 70–99)
Glucose, Bld: 84 mg/dL (ref 70–99)
Glucose, Bld: 89 mg/dL (ref 70–99)
Glucose, Bld: 149 mg/dL — ABNORMAL HIGH (ref 70–99)
Glucose, Bld: 184 mg/dL — ABNORMAL HIGH (ref 70–99)
HCT: 33 % — ABNORMAL LOW (ref 39.0–52.0)
HCT: 27 % — ABNORMAL LOW (ref 39.0–52.0)
HCT: 24 % — ABNORMAL LOW (ref 39.0–52.0)
HCT: 38 % — ABNORMAL LOW (ref 39.0–52.0)
HEMATOCRIT: 25 % — AB (ref 39.0–52.0)
HEMATOCRIT: 23 % — AB (ref 39.0–52.0)
HEMOGLOBIN: 9.2 g/dL — AB (ref 13.0–17.0)
Hemoglobin: 11.2 g/dL — ABNORMAL LOW (ref 13.0–17.0)
Hemoglobin: 8.5 g/dL — ABNORMAL LOW (ref 13.0–17.0)
Hemoglobin: 7.8 g/dL — ABNORMAL LOW (ref 13.0–17.0)
Hemoglobin: 8.2 g/dL — ABNORMAL LOW (ref 13.0–17.0)
Hemoglobin: 12.9 g/dL — ABNORMAL LOW (ref 13.0–17.0)
POTASSIUM: 4.2 meq/L (ref 3.7–5.3)
POTASSIUM: 4.7 meq/L (ref 3.7–5.3)
POTASSIUM: 4.9 meq/L (ref 3.7–5.3)
POTASSIUM: 5 meq/L (ref 3.7–5.3)
Potassium: 4.6 mEq/L (ref 3.7–5.3)
Potassium: 5.4 mEq/L — ABNORMAL HIGH (ref 3.7–5.3)
SODIUM: 141 meq/L (ref 137–147)
SODIUM: 140 meq/L (ref 137–147)
SODIUM: 130 meq/L — AB (ref 137–147)
SODIUM: 136 meq/L — AB (ref 137–147)
Sodium: 140 mEq/L (ref 137–147)
Sodium: 133 mEq/L — ABNORMAL LOW (ref 137–147)

## 2013-05-16 LAB — GLUCOSE, CAPILLARY
GLUCOSE-CAPILLARY: 167 mg/dL — AB (ref 70–99)
GLUCOSE-CAPILLARY: 174 mg/dL — AB (ref 70–99)
Glucose-Capillary: 84 mg/dL (ref 70–99)
Glucose-Capillary: 146 mg/dL — ABNORMAL HIGH (ref 70–99)

## 2013-05-16 LAB — CBC
HCT: 33.1 % — ABNORMAL LOW (ref 39.0–52.0)
HCT: 36.8 % — ABNORMAL LOW (ref 39.0–52.0)
HCT: 31 % — ABNORMAL LOW (ref 39.0–52.0)
HEMOGLOBIN: 11.4 g/dL — AB (ref 13.0–17.0)
Hemoglobin: 12.7 g/dL — ABNORMAL LOW (ref 13.0–17.0)
Hemoglobin: 10.9 g/dL — ABNORMAL LOW (ref 13.0–17.0)
MCH: 32.7 pg (ref 26.0–34.0)
MCH: 31.8 pg (ref 26.0–34.0)
MCH: 32 pg (ref 26.0–34.0)
MCHC: 34.4 g/dL (ref 30.0–36.0)
MCHC: 34.5 g/dL (ref 30.0–36.0)
MCHC: 35.2 g/dL (ref 30.0–36.0)
MCV: 94.8 fL (ref 78.0–100.0)
MCV: 92.2 fL (ref 78.0–100.0)
MCV: 90.9 fL (ref 78.0–100.0)
Platelets: 180 10*3/uL (ref 150–400)
Platelets: 176 10*3/uL (ref 150–400)
Platelets: 157 10*3/uL (ref 150–400)
RBC: 3.49 MIL/uL — AB (ref 4.22–5.81)
RBC: 3.99 MIL/uL — ABNORMAL LOW (ref 4.22–5.81)
RBC: 3.41 MIL/uL — ABNORMAL LOW (ref 4.22–5.81)
RDW: 15.4 % (ref 11.5–15.5)
RDW: 16 % — AB (ref 11.5–15.5)
RDW: 16.4 % — ABNORMAL HIGH (ref 11.5–15.5)
WBC: 6.5 10*3/uL (ref 4.0–10.5)
WBC: 17.8 10*3/uL — ABNORMAL HIGH (ref 4.0–10.5)
WBC: 10.1 10*3/uL (ref 4.0–10.5)

## 2013-05-16 LAB — PLATELET COUNT: Platelets: 129 10*3/uL — ABNORMAL LOW (ref 150–400)

## 2013-05-16 LAB — POCT I-STAT 3, VENOUS BLOOD GAS (G3P V)
Acid-base deficit: 3 mmol/L — ABNORMAL HIGH (ref 0.0–2.0)
Bicarbonate: 25.2 mEq/L — ABNORMAL HIGH (ref 20.0–24.0)
O2 Saturation: 77 %
PCO2 VEN: 59.4 mmHg — AB (ref 45.0–50.0)
PH VEN: 7.236 — AB (ref 7.250–7.300)
TCO2: 27 mmol/L (ref 0–100)
pO2, Ven: 50 mmHg — ABNORMAL HIGH (ref 30.0–45.0)

## 2013-05-16 LAB — PREPARE RBC (CROSSMATCH)

## 2013-05-16 LAB — BASIC METABOLIC PANEL
BUN: 21 mg/dL (ref 6–23)
CALCIUM: 8.5 mg/dL (ref 8.4–10.5)
CO2: 21 meq/L (ref 19–32)
CREATININE: 1.01 mg/dL (ref 0.50–1.35)
Chloride: 101 mEq/L (ref 96–112)
GFR calc Af Amer: 86 mL/min — ABNORMAL LOW (ref >90)
GFR calc non Af Amer: 74 mL/min — ABNORMAL LOW (ref >90)
GLUCOSE: 105 mg/dL — AB (ref 70–99)
Potassium: 4.6 mEq/L (ref 3.7–5.3)
SODIUM: 134 meq/L — AB (ref 137–147)

## 2013-05-16 LAB — MAGNESIUM: Magnesium: 2.9 mg/dL — ABNORMAL HIGH (ref 1.5–2.5)

## 2013-05-16 LAB — PROTIME-INR
INR: 1.34 (ref 0.00–1.49)
Prothrombin Time: 16.3 seconds — ABNORMAL HIGH (ref 11.6–15.2)

## 2013-05-16 LAB — HEMOGLOBIN AND HEMATOCRIT, BLOOD
HCT: 19 % — ABNORMAL LOW (ref 39.0–52.0)
Hemoglobin: 6.6 g/dL — CL (ref 13.0–17.0)

## 2013-05-16 LAB — CREATININE, SERUM
Creatinine, Ser: 0.88 mg/dL (ref 0.50–1.35)
GFR calc Af Amer: >90 mL/min (ref >90)
GFR calc non Af Amer: 86 mL/min — ABNORMAL LOW (ref >90)

## 2013-05-16 LAB — APTT: aPTT: 30 seconds (ref 24–37)

## 2013-05-16 LAB — HEPARIN LEVEL (UNFRACTIONATED): Heparin Unfractionated: 0.61 IU/mL (ref 0.30–0.70)

## 2013-05-16 SURGERY — CORONARY ARTERY BYPASS GRAFTING (CABG)
Anesthesia: General | Site: Chest

## 2013-05-16 MED ORDER — HEPARIN SODIUM (PORCINE) 1000 UNIT/ML IJ SOLN
INTRAMUSCULAR | Status: AC
  Filled 2013-05-16: qty 1

## 2013-05-16 MED ORDER — OXYCODONE HCL 5 MG PO TABS: 5.0000 mg | ORAL_TABLET | ORAL | Status: DC | PRN

## 2013-05-16 MED ORDER — SODIUM CHLORIDE 0.9 % IR SOLN
Status: DC | PRN
  Administered 2013-05-16: 1000 mL

## 2013-05-16 MED ORDER — ACETAMINOPHEN 650 MG RE SUPP
650.0000 mg | Freq: Once | RECTAL | Status: AC
  Administered 2013-05-16: 650 mg via RECTAL

## 2013-05-16 MED ORDER — ROSUVASTATIN CALCIUM 5 MG PO TABS
5.0000 mg | ORAL_TABLET | Freq: Every day | ORAL | Status: DC
  Administered 2013-05-17 – 2013-05-20 (×4): 5 mg via ORAL
  Filled 2013-05-16 (×6): qty 1

## 2013-05-16 MED ORDER — FAMOTIDINE IN NACL 20-0.9 MG/50ML-% IV SOLN
20.0000 mg | Freq: Two times a day (BID) | INTRAVENOUS | Status: AC
  Administered 2013-05-16 (×2): 20 mg via INTRAVENOUS
  Filled 2013-05-16: qty 50

## 2013-05-16 MED ORDER — SODIUM CHLORIDE 0.9 % IJ SOLN
INTRAMUSCULAR | Status: DC | PRN
  Administered 2013-05-16: 08:00:00 via TOPICAL

## 2013-05-16 MED ORDER — MIDAZOLAM HCL 10 MG/2ML IJ SOLN
INTRAMUSCULAR | Status: AC
  Filled 2013-05-16: qty 2

## 2013-05-16 MED ORDER — LIDOCAINE HCL (CARDIAC) 20 MG/ML IV SOLN
INTRAVENOUS | Status: AC
  Filled 2013-05-16: qty 5

## 2013-05-16 MED ORDER — PROTAMINE SULFATE 10 MG/ML IV SOLN
INTRAVENOUS | Status: DC | PRN
  Administered 2013-05-16: 30 mg via INTRAVENOUS
  Administered 2013-05-16 (×2): 50 mg via INTRAVENOUS
  Administered 2013-05-16: 30 mg via INTRAVENOUS
  Administered 2013-05-16 (×2): 50 mg via INTRAVENOUS
  Administered 2013-05-16: 10 mg via INTRAVENOUS
  Administered 2013-05-16 (×2): 50 mg via INTRAVENOUS

## 2013-05-16 MED ORDER — SODIUM CHLORIDE 0.9 % IJ SOLN: 3.0000 mL | INTRAMUSCULAR | Status: DC | PRN

## 2013-05-16 MED ORDER — INSULIN REGULAR HUMAN 100 UNIT/ML IJ SOLN
INTRAMUSCULAR | Status: DC
  Administered 2013-05-16: 4.1 [IU]/h via INTRAVENOUS
  Filled 2013-05-16 (×2): qty 1

## 2013-05-16 MED ORDER — FENTANYL CITRATE 0.05 MG/ML IJ SOLN
INTRAMUSCULAR | Status: DC | PRN
  Administered 2013-05-16: 200 ug via INTRAVENOUS
  Administered 2013-05-16: 150 ug via INTRAVENOUS
  Administered 2013-05-16: 250 ug via INTRAVENOUS
  Administered 2013-05-16: 50 ug via INTRAVENOUS
  Administered 2013-05-16: 100 ug via INTRAVENOUS

## 2013-05-16 MED ORDER — LACTATED RINGERS IV SOLN
INTRAVENOUS | Status: DC | PRN
  Administered 2013-05-16 (×2): via INTRAVENOUS

## 2013-05-16 MED ORDER — LIDOCAINE HCL (CARDIAC) 20 MG/ML IV SOLN
INTRAVENOUS | Status: DC | PRN
  Administered 2013-05-16: 100 mg via INTRAVENOUS

## 2013-05-16 MED ORDER — SODIUM CHLORIDE 0.9 % IV SOLN
INTRAVENOUS | Status: DC
  Administered 2013-05-17: 18:00:00 via INTRAVENOUS

## 2013-05-16 MED ORDER — MIDAZOLAM HCL 5 MG/5ML IJ SOLN
INTRAMUSCULAR | Status: DC | PRN
  Administered 2013-05-16: 1 mg via INTRAVENOUS
  Administered 2013-05-16: 2 mg via INTRAVENOUS
  Administered 2013-05-16: 4 mg via INTRAVENOUS
  Administered 2013-05-16: 3 mg via INTRAVENOUS

## 2013-05-16 MED ORDER — VECURONIUM BROMIDE 10 MG IV SOLR
INTRAVENOUS | Status: DC | PRN
  Administered 2013-05-16: 7 mg via INTRAVENOUS
  Administered 2013-05-16: 3 mg via INTRAVENOUS

## 2013-05-16 MED ORDER — PROTAMINE SULFATE 10 MG/ML IV SOLN
INTRAVENOUS | Status: AC
  Filled 2013-05-16: qty 5

## 2013-05-16 MED ORDER — FENTANYL CITRATE 0.05 MG/ML IJ SOLN
INTRAMUSCULAR | Status: AC
  Filled 2013-05-16: qty 5

## 2013-05-16 MED ORDER — SODIUM CHLORIDE 0.9 % IJ SOLN
3.0000 mL | Freq: Two times a day (BID) | INTRAMUSCULAR | Status: DC
  Administered 2013-05-17: 3 mL via INTRAVENOUS
  Administered 2013-05-17: 23:00:00 via INTRAVENOUS
  Administered 2013-05-18 – 2013-05-20 (×3): 3 mL via INTRAVENOUS

## 2013-05-16 MED ORDER — ROCURONIUM BROMIDE 100 MG/10ML IV SOLN
INTRAVENOUS | Status: DC | PRN
  Administered 2013-05-16: 70 mg via INTRAVENOUS
  Administered 2013-05-16: 30 mg via INTRAVENOUS

## 2013-05-16 MED ORDER — SODIUM CHLORIDE 0.45 % IV SOLN
INTRAVENOUS | Status: DC
Start: 2013-05-16 — End: 2013-05-21
  Administered 2013-05-16: 14:00:00 via INTRAVENOUS

## 2013-05-16 MED ORDER — MORPHINE SULFATE 2 MG/ML IJ SOLN
2.0000 mg | INTRAMUSCULAR | Status: DC | PRN
  Administered 2013-05-17 (×2): 4 mg via INTRAVENOUS
  Filled 2013-05-16 (×2): qty 2

## 2013-05-16 MED ORDER — ATROPINE SULFATE 0.1 MG/ML IJ SOLN
INTRAMUSCULAR | Status: AC
  Filled 2013-05-16: qty 10

## 2013-05-16 MED ORDER — SODIUM CHLORIDE 0.9 % IV SOLN
INTRAVENOUS | Status: DC | PRN
  Administered 2013-05-16: 12:00:00 via INTRAVENOUS

## 2013-05-16 MED ORDER — PHENYLEPHRINE HCL 10 MG/ML IJ SOLN
10.0000 mg | INTRAMUSCULAR | Status: DC | PRN
  Administered 2013-05-16: 50 ug/min via INTRAVENOUS

## 2013-05-16 MED ORDER — ARTIFICIAL TEARS OP OINT
TOPICAL_OINTMENT | OPHTHALMIC | Status: DC | PRN
  Administered 2013-05-16: 1 via OPHTHALMIC

## 2013-05-16 MED ORDER — NITROGLYCERIN IN D5W 200-5 MCG/ML-% IV SOLN: 0.0000 ug/min | INTRAVENOUS | Status: DC

## 2013-05-16 MED ORDER — DOPAMINE-DEXTROSE 1.6-5 MG/ML-% IV SOLN
INTRAVENOUS | Status: DC | PRN
  Administered 2013-05-16: 3 ug/kg/min via INTRAVENOUS

## 2013-05-16 MED ORDER — BISACODYL 5 MG PO TBEC
10.0000 mg | DELAYED_RELEASE_TABLET | Freq: Every day | ORAL | Status: DC
  Administered 2013-05-17 – 2013-05-20 (×3): 10 mg via ORAL
  Filled 2013-05-16 (×3): qty 2

## 2013-05-16 MED ORDER — PANTOPRAZOLE SODIUM 40 MG PO TBEC
40.0000 mg | DELAYED_RELEASE_TABLET | Freq: Every day | ORAL | Status: DC
Start: 2013-05-18 — End: 2013-05-21
  Administered 2013-05-18 – 2013-05-21 (×4): 40 mg via ORAL
  Filled 2013-05-16 (×4): qty 1

## 2013-05-16 MED ORDER — INSULIN REGULAR BOLUS VIA INFUSION
0.0000 [IU] | Freq: Three times a day (TID) | INTRAVENOUS | Status: DC
  Filled 2013-05-16: qty 10

## 2013-05-16 MED ORDER — ACETAMINOPHEN 500 MG PO TABS
1000.0000 mg | ORAL_TABLET | Freq: Four times a day (QID) | ORAL | Status: DC
  Administered 2013-05-17 – 2013-05-21 (×12): 1000 mg via ORAL
  Filled 2013-05-16 (×18): qty 2

## 2013-05-16 MED ORDER — METOPROLOL TARTRATE 1 MG/ML IV SOLN
2.5000 mg | INTRAVENOUS | Status: DC | PRN
  Administered 2013-05-17: 5 mg via INTRAVENOUS
  Filled 2013-05-16: qty 5

## 2013-05-16 MED ORDER — METOPROLOL TARTRATE 12.5 MG HALF TABLET
12.5000 mg | ORAL_TABLET | Freq: Two times a day (BID) | ORAL | Status: DC
Start: 2013-05-16 — End: 2013-05-19
  Administered 2013-05-17 – 2013-05-18 (×3): 12.5 mg via ORAL
  Filled 2013-05-16 (×7): qty 1

## 2013-05-16 MED ORDER — DOPAMINE-DEXTROSE 3.2-5 MG/ML-% IV SOLN: 0.0000 ug/kg/min | INTRAVENOUS | Status: DC

## 2013-05-16 MED ORDER — ALBUMIN HUMAN 5 % IV SOLN
250.0000 mL | INTRAVENOUS | Status: AC | PRN
  Administered 2013-05-16 (×3): 250 mL via INTRAVENOUS
  Filled 2013-05-16: qty 250

## 2013-05-16 MED ORDER — MORPHINE SULFATE 2 MG/ML IJ SOLN
1.0000 mg | INTRAMUSCULAR | Status: AC | PRN
Start: 2013-05-16 — End: 2013-05-17
  Administered 2013-05-16: 2 mg via INTRAVENOUS
  Filled 2013-05-16: qty 1

## 2013-05-16 MED ORDER — HEMOSTATIC AGENTS (NO CHARGE) OPTIME
TOPICAL | Status: DC | PRN
  Administered 2013-05-16: 1 via TOPICAL

## 2013-05-16 MED ORDER — POTASSIUM CHLORIDE 10 MEQ/50ML IV SOLN: 10.0000 meq | INTRAVENOUS | Status: AC

## 2013-05-16 MED ORDER — ONDANSETRON HCL 4 MG/2ML IJ SOLN: 4.0000 mg | Freq: Four times a day (QID) | INTRAMUSCULAR | Status: DC | PRN

## 2013-05-16 MED ORDER — NITROGLYCERIN IN D5W 200-5 MCG/ML-% IV SOLN
INTRAVENOUS | Status: DC | PRN
  Administered 2013-05-16: 5 ug/min via INTRAVENOUS

## 2013-05-16 MED ORDER — DIAZEPAM 5 MG PO TABS
5.0000 mg | ORAL_TABLET | Freq: Four times a day (QID) | ORAL | Status: AC | PRN
  Administered 2013-05-16: 5 mg via ORAL
  Filled 2013-05-16: qty 1

## 2013-05-16 MED ORDER — ACETAMINOPHEN 160 MG/5ML PO SOLN
1000.0000 mg | Freq: Four times a day (QID) | ORAL | Status: DC
Start: 2013-05-17 — End: 2013-05-21
  Administered 2013-05-16: 1000 mg
  Filled 2013-05-16: qty 40.6

## 2013-05-16 MED ORDER — MAGNESIUM SULFATE 4000MG/100ML IJ SOLN
4.0000 g | Freq: Once | INTRAMUSCULAR | Status: AC
  Administered 2013-05-16: 4 g via INTRAVENOUS
  Filled 2013-05-16: qty 100

## 2013-05-16 MED ORDER — ROCURONIUM BROMIDE 50 MG/5ML IV SOLN
INTRAVENOUS | Status: AC
  Filled 2013-05-16: qty 3

## 2013-05-16 MED ORDER — DEXTROSE 5 % IV SOLN
0.0000 ug/min | INTRAVENOUS | Status: DC
  Filled 2013-05-16: qty 2

## 2013-05-16 MED ORDER — SODIUM CHLORIDE 0.9 % IJ SOLN
INTRAMUSCULAR | Status: AC
  Filled 2013-05-16: qty 10

## 2013-05-16 MED ORDER — LACTATED RINGERS IV SOLN
INTRAVENOUS | Status: DC | PRN
  Administered 2013-05-16: 07:00:00 via INTRAVENOUS

## 2013-05-16 MED ORDER — HEPARIN SODIUM (PORCINE) 1000 UNIT/ML IJ SOLN
INTRAMUSCULAR | Status: DC | PRN
  Administered 2013-05-16: 33000 [IU] via INTRAVENOUS
  Administered 2013-05-16 (×2): 2000 [IU] via INTRAVENOUS

## 2013-05-16 MED ORDER — ARTIFICIAL TEARS OP OINT
TOPICAL_OINTMENT | OPHTHALMIC | Status: AC
  Filled 2013-05-16: qty 3.5

## 2013-05-16 MED ORDER — MILRINONE IN DEXTROSE 20 MG/100ML IV SOLN
INTRAVENOUS | Status: DC | PRN
  Administered 2013-05-16: .3 ug/kg/min via INTRAVENOUS

## 2013-05-16 MED ORDER — DOCUSATE SODIUM 100 MG PO CAPS
200.0000 mg | ORAL_CAPSULE | Freq: Every day | ORAL | Status: DC
  Administered 2013-05-17 – 2013-05-21 (×5): 200 mg via ORAL
  Filled 2013-05-16 (×5): qty 2

## 2013-05-16 MED ORDER — ASPIRIN EC 325 MG PO TBEC
325.0000 mg | DELAYED_RELEASE_TABLET | Freq: Every day | ORAL | Status: DC
  Administered 2013-05-17 – 2013-05-21 (×5): 325 mg via ORAL
  Filled 2013-05-16 (×5): qty 1

## 2013-05-16 MED ORDER — SODIUM CHLORIDE 0.9 % IV SOLN
250.0000 mL | INTRAVENOUS | Status: DC
Start: 2013-05-17 — End: 2013-05-21

## 2013-05-16 MED ORDER — DEXMEDETOMIDINE HCL IN NACL 200 MCG/50ML IV SOLN
0.1000 ug/kg/h | INTRAVENOUS | Status: DC
  Administered 2013-05-16 (×2): 0.5 ug/kg/h via INTRAVENOUS
  Filled 2013-05-16 (×2): qty 50

## 2013-05-16 MED ORDER — MILRINONE IN DEXTROSE 20 MG/100ML IV SOLN
0.2500 ug/kg/min | INTRAVENOUS | Status: DC
  Filled 2013-05-16: qty 100

## 2013-05-16 MED ORDER — MILRINONE IN DEXTROSE 20 MG/100ML IV SOLN
0.3000 ug/kg/min | INTRAVENOUS | Status: DC
Start: 2013-05-16 — End: 2013-05-17
  Administered 2013-05-16 – 2013-05-17 (×2): 0.3 ug/kg/min via INTRAVENOUS
  Filled 2013-05-16 (×2): qty 100

## 2013-05-16 MED ORDER — BISACODYL 10 MG RE SUPP: 10.0000 mg | Freq: Every day | RECTAL | Status: DC

## 2013-05-16 MED ORDER — NON FORMULARY: 5.0000 mg | Freq: Every morning | Status: DC

## 2013-05-16 MED ORDER — DEXTROSE 5 % IV SOLN
1.5000 g | Freq: Two times a day (BID) | INTRAVENOUS | Status: AC
  Administered 2013-05-16 – 2013-05-18 (×4): 1.5 g via INTRAVENOUS
  Filled 2013-05-16 (×5): qty 1.5

## 2013-05-16 MED ORDER — CYCLOSPORINE 100 MG PO CAPS
100.0000 mg | ORAL_CAPSULE | Freq: Every day | ORAL | Status: DC
  Administered 2013-05-17 – 2013-05-21 (×5): 100 mg via ORAL
  Filled 2013-05-16 (×5): qty 1

## 2013-05-16 MED ORDER — LACTATED RINGERS IV SOLN
500.0000 mL | Freq: Once | INTRAVENOUS | Status: AC | PRN
  Administered 2013-05-16: 500 mL via INTRAVENOUS

## 2013-05-16 MED ORDER — VANCOMYCIN HCL IN DEXTROSE 1-5 GM/200ML-% IV SOLN
1000.0000 mg | Freq: Once | INTRAVENOUS | Status: AC
  Administered 2013-05-16: 1000 mg via INTRAVENOUS
  Filled 2013-05-16: qty 200

## 2013-05-16 MED ORDER — LACTATED RINGERS IV SOLN: INTRAVENOUS | Status: DC

## 2013-05-16 MED ORDER — METOPROLOL TARTRATE 25 MG/10 ML ORAL SUSPENSION
12.5000 mg | Freq: Two times a day (BID) | ORAL | Status: DC
  Administered 2013-05-18: 12.5 mg
  Filled 2013-05-16 (×7): qty 5

## 2013-05-16 MED ORDER — MIDAZOLAM HCL 2 MG/2ML IJ SOLN: 2.0000 mg | INTRAMUSCULAR | Status: DC | PRN

## 2013-05-16 MED ORDER — ASPIRIN 81 MG PO CHEW: 324.0000 mg | CHEWABLE_TABLET | Freq: Every day | ORAL | Status: DC

## 2013-05-16 MED ORDER — PROPOFOL 10 MG/ML IV BOLUS
INTRAVENOUS | Status: DC | PRN
  Administered 2013-05-16: 150 mg via INTRAVENOUS

## 2013-05-16 MED ORDER — ACETAMINOPHEN 160 MG/5ML PO SOLN: 650.0000 mg | Freq: Once | ORAL | Status: AC

## 2013-05-16 MED ORDER — PROTAMINE SULFATE 10 MG/ML IV SOLN
INTRAVENOUS | Status: AC
  Filled 2013-05-16: qty 25

## 2013-05-16 SURGICAL SUPPLY — 127 items
ADAPTER CARDIO PERF ANTE/RETRO (ADAPTER) ×4 IMPLANT
APPLICATOR COTTON TIP 6IN STRL (MISCELLANEOUS) IMPLANT
APPLICATOR TIP EXT COSEAL (VASCULAR PRODUCTS) ×4 IMPLANT
ATTRACTOMAT 16X20 MAGNETIC DRP (DRAPES) ×4 IMPLANT
BAG DECANTER FOR FLEXI CONT (MISCELLANEOUS) ×4 IMPLANT
BANDAGE ELASTIC 4 VELCRO ST LF (GAUZE/BANDAGES/DRESSINGS) ×4 IMPLANT
BANDAGE ELASTIC 6 VELCRO ST LF (GAUZE/BANDAGES/DRESSINGS) ×4 IMPLANT
BANDAGE GAUZE ELAST BULKY 4 IN (GAUZE/BANDAGES/DRESSINGS) ×4 IMPLANT
BASKET HEART  (ORDER IN 25'S) (MISCELLANEOUS) ×1
BASKET HEART (ORDER IN 25'S) (MISCELLANEOUS) ×1
BASKET HEART (ORDER IN 25S) (MISCELLANEOUS) ×2 IMPLANT
BENZOIN TINCTURE PRP APPL 2/3 (GAUZE/BANDAGES/DRESSINGS) ×4 IMPLANT
BLADE MINI RND TIP GREEN BEAV (BLADE) ×4 IMPLANT
BLADE STERNUM SYSTEM 6 (BLADE) ×4 IMPLANT
BLADE SURG 11 STRL SS (BLADE) ×4 IMPLANT
BLADE SURG 12 STRL SS (BLADE) ×4 IMPLANT
BLADE SURG 15 STRL LF DISP TIS (BLADE) ×2 IMPLANT
BLADE SURG 15 STRL SS (BLADE) ×2
BLADE SURG ROTATE 9660 (MISCELLANEOUS) IMPLANT
BOOT SUTURE AID YELLOW STND (SUTURE) IMPLANT
CANISTER SUCTION 2500CC (MISCELLANEOUS) ×4 IMPLANT
CANNULA GUNDRY RCSP 15FR (MISCELLANEOUS) ×4 IMPLANT
CANNULA VENOUS LOW PROF 32X40 (CANNULA) IMPLANT
CANNULA VESSEL 3MM BLUNT TIP (CANNULA) ×12 IMPLANT
CARDIAC SUCTION (MISCELLANEOUS) ×4 IMPLANT
CATH CPB KIT VANTRIGT (MISCELLANEOUS) ×4 IMPLANT
CATH ROBINSON RED A/P 18FR (CATHETERS) ×16 IMPLANT
CATH THORACIC 36FR RT ANG (CATHETERS) ×4 IMPLANT
CLIP TI WIDE RED SMALL 24 (CLIP) ×4 IMPLANT
CLOSURE WOUND 1/2 X4 (GAUZE/BANDAGES/DRESSINGS) ×2
CONN 1/2X1/2X1/2  BEN (MISCELLANEOUS)
CONN 1/2X1/2X1/2 BEN (MISCELLANEOUS) IMPLANT
CONN 3/8X1/2 ST GISH (MISCELLANEOUS) IMPLANT
CONT SPEC 4OZ CLIKSEAL STRL BL (MISCELLANEOUS) ×4 IMPLANT
COVER SURGICAL LIGHT HANDLE (MISCELLANEOUS) ×4 IMPLANT
CRADLE DONUT ADULT HEAD (MISCELLANEOUS) ×4 IMPLANT
DRAIN CHANNEL 32F RND 10.7 FF (WOUND CARE) ×4 IMPLANT
DRAPE CARDIOVASCULAR INCISE (DRAPES) ×2
DRAPE SLUSH/WARMER DISC (DRAPES) ×4 IMPLANT
DRAPE SRG 135X102X78XABS (DRAPES) ×2 IMPLANT
DRSG AQUACEL AG ADV 3.5X14 (GAUZE/BANDAGES/DRESSINGS) ×4 IMPLANT
ELECT BLADE 4.0 EZ CLEAN MEGAD (MISCELLANEOUS) ×4
ELECT BLADE 6.5 EXT (BLADE) ×4 IMPLANT
ELECT CAUTERY BLADE 6.4 (BLADE) ×4 IMPLANT
ELECT REM PT RETURN 9FT ADLT (ELECTROSURGICAL) ×8
ELECTRODE BLDE 4.0 EZ CLN MEGD (MISCELLANEOUS) ×2 IMPLANT
ELECTRODE REM PT RTRN 9FT ADLT (ELECTROSURGICAL) ×4 IMPLANT
GLOVE BIO SURGEON STRL SZ 6 (GLOVE) ×8 IMPLANT
GLOVE BIO SURGEON STRL SZ 6.5 (GLOVE) ×24 IMPLANT
GLOVE BIO SURGEON STRL SZ7.5 (GLOVE) ×12 IMPLANT
GLOVE BIO SURGEONS STRL SZ 6.5 (GLOVE) ×8
GOWN STRL REUS W/ TWL LRG LVL3 (GOWN DISPOSABLE) ×16 IMPLANT
GOWN STRL REUS W/TWL LRG LVL3 (GOWN DISPOSABLE) ×16
HANDLE STAPLE ENDO GIA SHORT (STAPLE) ×2
HEMOSTAT POWDER SURGIFOAM 1G (HEMOSTASIS) ×12 IMPLANT
HEMOSTAT SURGICEL 2X14 (HEMOSTASIS) ×4 IMPLANT
INSERT FOGARTY XLG (MISCELLANEOUS) IMPLANT
KIT BASIN OR (CUSTOM PROCEDURE TRAY) ×4 IMPLANT
KIT ROOM TURNOVER OR (KITS) ×4 IMPLANT
KIT SUCTION CATH 14FR (SUCTIONS) ×4 IMPLANT
KIT VASOVIEW W/TROCAR VH 2000 (KITS) ×4 IMPLANT
LEAD PACING MYOCARDI (MISCELLANEOUS) ×4 IMPLANT
MARKER GRAFT CORONARY BYPASS (MISCELLANEOUS) ×12 IMPLANT
MATRIX HEMOSTAT SURGIFLO (HEMOSTASIS) ×4 IMPLANT
NS IRRIG 1000ML POUR BTL (IV SOLUTION) ×24 IMPLANT
PACK OPEN HEART (CUSTOM PROCEDURE TRAY) ×4 IMPLANT
PAD ARMBOARD 7.5X6 YLW CONV (MISCELLANEOUS) ×8 IMPLANT
PAD ELECT DEFIB RADIOL ZOLL (MISCELLANEOUS) ×4 IMPLANT
PENCIL BUTTON HOLSTER BLD 10FT (ELECTRODE) ×4 IMPLANT
PUNCH AORTIC ROT 4.0MM RCL 40 (MISCELLANEOUS) ×4 IMPLANT
PUNCH AORTIC ROTATE 4.0MM (MISCELLANEOUS) IMPLANT
PUNCH AORTIC ROTATE 4.5MM 8IN (MISCELLANEOUS) IMPLANT
PUNCH AORTIC ROTATE 5MM 8IN (MISCELLANEOUS) IMPLANT
RELOAD EGIA 45 MED/THCK PURPLE (STAPLE) ×4 IMPLANT
SEALANT SURG COSEAL 8ML (VASCULAR PRODUCTS) ×4 IMPLANT
SET CARDIOPLEGIA MPS 5001102 (MISCELLANEOUS) ×4 IMPLANT
SPONGE GAUZE 4X4 12PLY (GAUZE/BANDAGES/DRESSINGS) ×4 IMPLANT
SPONGE GAUZE 4X4 12PLY STER LF (GAUZE/BANDAGES/DRESSINGS) ×8 IMPLANT
SPONGE LAP 18X18 X RAY DECT (DISPOSABLE) ×8 IMPLANT
SPONGE LAP 4X18 X RAY DECT (DISPOSABLE) ×4 IMPLANT
STAPLER ENDO GIA 12MM SHORT (STAPLE) ×2 IMPLANT
STRIP CLOSURE SKIN 1/2X4 (GAUZE/BANDAGES/DRESSINGS) ×6 IMPLANT
SUCKER WEIGHTED FLEX (MISCELLANEOUS) IMPLANT
SURGIFLO W/THROMBIN 8M KIT (HEMOSTASIS) ×4 IMPLANT
SUT BONE WAX W31G (SUTURE) ×4 IMPLANT
SUT ETHIBOND 2 0 SH (SUTURE) IMPLANT
SUT ETHIBOND 2 0 SH 36X2 (SUTURE) IMPLANT
SUT ETHIBOND 2 0 V4 (SUTURE) IMPLANT
SUT ETHIBOND 2 0V4 GREEN (SUTURE) IMPLANT
SUT ETHIBOND 4 0 TF (SUTURE) IMPLANT
SUT ETHIBOND 5 0 C 1 30 (SUTURE) IMPLANT
SUT MNCRL AB 4-0 PS2 18 (SUTURE) ×8 IMPLANT
SUT PROLENE 3 0 RB 1 (SUTURE) IMPLANT
SUT PROLENE 3 0 SH 1 (SUTURE) IMPLANT
SUT PROLENE 3 0 SH DA (SUTURE) ×4 IMPLANT
SUT PROLENE 3 0 SH1 36 (SUTURE) IMPLANT
SUT PROLENE 4 0 RB 1 (SUTURE) ×6
SUT PROLENE 4 0 SH DA (SUTURE) ×4 IMPLANT
SUT PROLENE 4-0 RB1 .5 CRCL 36 (SUTURE) ×6 IMPLANT
SUT PROLENE 5 0 C 1 36 (SUTURE) ×4 IMPLANT
SUT PROLENE 6 0 C 1 30 (SUTURE) ×4 IMPLANT
SUT PROLENE 6 0 CC (SUTURE) ×12 IMPLANT
SUT PROLENE 7.0 RB 3 (SUTURE) ×12 IMPLANT
SUT PROLENE 8 0 BV175 6 (SUTURE) ×12 IMPLANT
SUT PROLENE BLUE 7 0 (SUTURE) ×8 IMPLANT
SUT SILK  1 MH (SUTURE)
SUT SILK 1 MH (SUTURE) IMPLANT
SUT SILK 2 0 SH CR/8 (SUTURE) IMPLANT
SUT SILK 3 0 SH CR/8 (SUTURE) IMPLANT
SUT STEEL 6MS V (SUTURE) ×8 IMPLANT
SUT STEEL SZ 6 DBL 3X14 BALL (SUTURE) ×8 IMPLANT
SUT VIC AB 1 CTX 36 (SUTURE) ×8
SUT VIC AB 1 CTX36XBRD ANBCTR (SUTURE) ×8 IMPLANT
SUT VIC AB 2-0 CT1 27 (SUTURE) ×6
SUT VIC AB 2-0 CT1 TAPERPNT 27 (SUTURE) ×6 IMPLANT
SUT VIC AB 2-0 CTX 27 (SUTURE) IMPLANT
SUT VIC AB 3-0 X1 27 (SUTURE) IMPLANT
SUTURE E-PAK OPEN HEART (SUTURE) ×4 IMPLANT
SYSTEM SAHARA CHEST DRAIN ATS (WOUND CARE) ×4 IMPLANT
TAPE CLOTH SOFT 2X10 (GAUZE/BANDAGES/DRESSINGS) ×4 IMPLANT
TAPE CLOTH SURG 4X10 WHT LF (GAUZE/BANDAGES/DRESSINGS) ×8 IMPLANT
TOWEL OR 17X24 6PK STRL BLUE (TOWEL DISPOSABLE) ×16 IMPLANT
TOWEL OR 17X26 10 PK STRL BLUE (TOWEL DISPOSABLE) ×16 IMPLANT
TRAY FOLEY IC TEMP SENS 14FR (CATHETERS) ×4 IMPLANT
TUBING INSUFFLATION 10FT LAP (TUBING) ×4 IMPLANT
UNDERPAD 30X30 INCONTINENT (UNDERPADS AND DIAPERS) ×4 IMPLANT
WATER STERILE IRR 1000ML POUR (IV SOLUTION) ×8 IMPLANT

## 2013-05-16 NOTE — Progress Notes (Signed)
  Echocardiogram Echocardiogram Transesophageal has been performed.  Philipp Deputy 05/16/2013, 2:43 PM

## 2013-05-16 NOTE — Anesthesia Preprocedure Evaluation (Addendum)
Anesthesia Evaluation  Patient identified by MRN, date of birth, ID band Patient awake    Reviewed: Allergy & Precautions, H&P , NPO status , Patient's Chart, lab work & pertinent test results  History of Anesthesia Complications Negative for: history of anesthetic complications  Airway Mallampati: II TM Distance: >3 FB Neck ROM: Limited    Dental  (+) Chipped, Dental Advisory Given, Poor Dentition, Missing   Pulmonary neg pulmonary ROS, former smoker,    Pulmonary exam normal       Cardiovascular Exercise Tolerance: Poor hypertension, Pt. on medications + CAD and + Past MI + Valvular Problems/Murmurs MR Rhythm:Regular Rate:Normal  05-12-13 2 D ECHO Left ventricle: The cavity size was normal. Wall thickness   was increased in a pattern of mild LVH. Systolic function   was normal. The estimated ejection fraction was in the   range of 50% to 55%. There is mild hypokinesis of the   entireinferior myocardium. Doppler parameters are   consistent with restrictive physiology, indicative of   decreased left ventricular diastolic compliance and/or   increased left atrial pressure. Moderate regurgitation.   Neuro/Psych negative neurological ROS  negative psych ROS   GI/Hepatic Neg liver ROS, GERD-  Medicated,  Endo/Other  diabetes, Well Controlled, Type 2, Oral Hypoglycemic Agents  Renal/GU negative Renal ROS  negative genitourinary   Musculoskeletal  (+) Arthritis -, Rheumatoid disorders,    Abdominal   Peds  Hematology negative hematology ROS (+)   Anesthesia Other Findings   Reproductive/Obstetrics negative OB ROS                      Anesthesia Physical Anesthesia Plan  ASA: IV  Anesthesia Plan: General   Post-op Pain Management:    Induction: Intravenous  Airway Management Planned: Oral ETT  Additional Equipment: Arterial line, PA Cath and 3D TEE  Intra-op Plan:   Post-operative Plan:  Post-operative intubation/ventilation  Informed Consent:   Plan Discussed with: CRNA, Anesthesiologist and Surgeon  Anesthesia Plan Comments:         Anesthesia Quick Evaluation

## 2013-05-16 NOTE — Progress Notes (Signed)
RT removed NO from pts vent circuit with no complications.  Rt prepared for rapis wean at this time.

## 2013-05-16 NOTE — Anesthesia Procedure Notes (Addendum)
Procedure Name: Intubation Date/Time: 05/16/2013 7:37 AM Performed by: Wanita Chamberlain Pre-anesthesia Checklist: Patient identified, Timeout performed, Emergency Drugs available, Suction available and Patient being monitored Patient Re-evaluated:Patient Re-evaluated prior to inductionOxygen Delivery Method: Circle system utilized Preoxygenation: Pre-oxygenation with 100% oxygen Intubation Type: IV induction Ventilation: Mask ventilation without difficulty and Oral airway inserted - appropriate to patient size Laryngoscope Size: Mac and 3 Grade View: Grade I Tube type: Oral Tube size: 8.0 mm Number of attempts: 1 Airway Equipment and Method: Stylet Placement Confirmation: ETT inserted through vocal cords under direct vision,  breath sounds checked- equal and bilateral and positive ETCO2 Secured at: 23 cm Tube secured with: Tape Dental Injury: Teeth and Oropharynx as per pre-operative assessment     Procedures: Right IJ Gordy Councilman Catheter Insertion:[id number]: The patient was identified and consent obtained.  TO was performed, and full barrier precautions were used.  The skin was anesthetized with lidocaine-4cc plain with 25g needle.  Once the vein was located with the 22 ga. needle using ultrasound guidance , the wire was inserted into the vein.  The wire location was confirmed with ultrasound.  The tissue was dilated and the 8.5 [place] cordis catheter was carefully inserted. Afterwards Gordy Councilman catheter was inserted. PA catheter at 48cm.  The patient tolerated the procedure well.   CE

## 2013-05-16 NOTE — Preoperative (Addendum)
Beta Blockers   Metoprolol 12. 5 mg PO @ 5:14     05-16-13

## 2013-05-16 NOTE — Anesthesia Postprocedure Evaluation (Signed)
  Anesthesia Post-op Note  Patient: Francis Wilkinson  Procedure(s) Performed: Procedure(s) with comments: CORONARY ARTERY BYPASS GRAFTING (CABG) (N/A) - Times 2 using left internal mammary artery and endoscopically harvested right saphenous vein. INTRAOPERATIVE TRANSESOPHAGEAL ECHOCARDIOGRAM (N/A)  Patient Location: SICU  Anesthesia Type:General  Level of Consciousness: Patient remains intubated per anesthesia plan  Airway and Oxygen Therapy: Patient remains intubated per anesthesia plan  Post-op Pain: none  Post-op Assessment: Post-op Vital signs reviewed and Patient's Cardiovascular Status Stable  Post-op Vital Signs: Reviewed and stable  Complications: No apparent anesthesia complications

## 2013-05-16 NOTE — OR Nursing (Signed)
SICU First call @ [phone]

## 2013-05-16 NOTE — Progress Notes (Signed)
UR Completed.  Francis Wilkinson [id number] 05/16/2013

## 2013-05-16 NOTE — Progress Notes (Signed)
The patient was examined and preop studies reviewed. There has been no change from the prior exam and the patient is ready for surgery.   Plan CABG, possible MVR on C [place] today

## 2013-05-16 NOTE — Transfer of Care (Signed)
Immediate Anesthesia Transfer of Care Note  Patient: Francis Wilkinson  Procedure(s) Performed: Procedure(s) with comments: CORONARY ARTERY BYPASS GRAFTING (CABG) (N/A) - Times 2 using left internal mammary artery and endoscopically harvested right saphenous vein. INTRAOPERATIVE TRANSESOPHAGEAL ECHOCARDIOGRAM (N/A)  Patient Location: SICU  Anesthesia Type:General  Level of Consciousness: unresponsive and Patient remains intubated per anesthesia plan  Airway & Oxygen Therapy: Patient remains intubated per anesthesia plan and Patient placed on Ventilator (see vital sign flow sheet for setting)  Post-op Assessment: Report given to PACU RN and Post -op Vital signs reviewed and stable  Post vital signs: Reviewed and stable  Complications: No apparent anesthesia complications

## 2013-05-16 NOTE — Brief Op Note (Addendum)
05/11/2013 - 05/16/2013  11:00 AM  PATIENT:  Francis Wilkinson  68 y.o. male  PRE-OPERATIVE DIAGNOSIS:  Coronary Artery Disease  POST-OPERATIVE DIAGNOSIS:  Coronary Artery Disease  PROCEDURE:   CORONARY ARTERY BYPASS GRAFTING x 2 (LIMA-LAD, SVG-PD) LEFT UPPER LOBE LUNG BIOPSY ENDOSCOPIC VEIN HARVEST RIGHT THIGH  SURGEON:  [name], MD  ASSISTANT: Suzzanne Cloud, PA-C  ANESTHESIA:   general  PATIENT CONDITION:  ICU - intubated and hemodynamically stable.  PRE-OPERATIVE WEIGHT: 93 kg

## 2013-05-16 NOTE — Progress Notes (Signed)
Patient ID: CARDER YIN, male   DOB: December 26, 1945, 68 y.o.   MRN: [id number] EVENING ROUNDS NOTE :     [address]       [place] [address]             [phone]                 Day of Surgery Procedure(s) (LRB): CORONARY ARTERY BYPASS GRAFTING (CABG) (N/A) INTRAOPERATIVE TRANSESOPHAGEAL ECHOCARDIOGRAM (N/A)  Total Length of Stay:  LOS: 5 days  BP 94/61  Pulse 80  Temp(Src) 99.1 F (37.3 C) (Oral)  Resp 16  Ht 5\' 8"  (1.727 m)  Wt 205 lb 0.4 oz (93 kg)  BMI 31.18 kg/m2  SpO2 99%  .Intake/Output     03/02 0701 - 03/03 0700   P.O. 30   I.V. (mL/kg) 4669.6 (50.2)   Blood 700   IV Piggyback 650   Total Intake(mL/kg) 6049.6 (65)   Urine (mL/kg/hr) 2190 (1.8)   Chest Tube 90 (0.1)   Total Output 2280   Net +3769.6         . sodium chloride 20 mL/hr at 05/16/13 1800  . sodium chloride 20 mL/hr at 05/16/13 1800  . [START ON 05/17/2013] sodium chloride    . dexmedetomidine 0.5 mcg/kg/hr (05/16/13 1800)  . DOPamine 2 mcg/kg/min (05/16/13 1800)  . insulin (NOVOLIN-R) infusion 4 Units/hr (05/16/13 1800)  . lactated ringers 20 mL/hr at 05/16/13 1800  . milrinone 0.3 mcg/kg/min (05/16/13 1800)  . nitroGLYCERIN Stopped (05/16/13 1300)  . phenylephrine (NEO-SYNEPHRINE) Adult infusion 15 mcg/min (05/16/13 1800)     Lab Results  Component Value Date   WBC 17.8* 05/16/2013   HGB 10.5* 05/16/2013   HCT 31.0* 05/16/2013   PLT 176 05/16/2013   GLUCOSE 117* 05/16/2013   CHOL 119 05/12/2013   TRIG 131 05/12/2013   HDL 15* 05/12/2013   LDLCALC 78 05/12/2013   ALT 16 05/13/2013   AST 25 05/13/2013   NA 139 05/16/2013   K 4.3 05/16/2013   CL 104 05/16/2013   CREATININE 0.90 05/16/2013   BUN 15 05/16/2013   CO2 21 05/16/2013   TSH 4.659* 05/11/2013   INR 1.34 05/16/2013   HGBA1C 8.9* 05/13/2013  stable on vent and NO 4.3 ppm Pa pressures low Not bleeding Lipitor stopped per pharmacy recommendatuion  Grace Isaac MD  Beeper [phone] Office [phone] 05/16/2013 7:57  PM

## 2013-05-17 ENCOUNTER — Inpatient Hospital Stay (HOSPITAL_COMMUNITY): Payer: Medicare Other

## 2013-05-17 ENCOUNTER — Encounter (HOSPITAL_COMMUNITY): Payer: Self-pay | Admitting: Cardiothoracic Surgery

## 2013-05-17 LAB — CBC
HCT: 28.3 % — ABNORMAL LOW (ref 39.0–52.0)
HEMATOCRIT: 28.2 % — AB (ref 39.0–52.0)
HEMOGLOBIN: 10.1 g/dL — AB (ref 13.0–17.0)
Hemoglobin: 9.9 g/dL — ABNORMAL LOW (ref 13.0–17.0)
MCH: 32.6 pg (ref 26.0–34.0)
MCH: 32.5 pg (ref 26.0–34.0)
MCHC: 35.8 g/dL (ref 30.0–36.0)
MCHC: 35 g/dL (ref 30.0–36.0)
MCV: 91 fL (ref 78.0–100.0)
MCV: 92.8 fL (ref 78.0–100.0)
PLATELETS: 125 10*3/uL — AB (ref 150–400)
Platelets: 134 10*3/uL — ABNORMAL LOW (ref 150–400)
RBC: 3.1 MIL/uL — AB (ref 4.22–5.81)
RBC: 3.05 MIL/uL — AB (ref 4.22–5.81)
RDW: 16.3 % — ABNORMAL HIGH (ref 11.5–15.5)
RDW: 16.5 % — ABNORMAL HIGH (ref 11.5–15.5)
WBC: 9.5 10*3/uL (ref 4.0–10.5)
WBC: 8.4 10*3/uL (ref 4.0–10.5)

## 2013-05-17 LAB — POCT I-STAT 3, ART BLOOD GAS (G3+)
Acid-base deficit: 2 mmol/L (ref 0.0–2.0)
Acid-base deficit: 2 mmol/L (ref 0.0–2.0)
Bicarbonate: 22.1 mEq/L (ref 20.0–24.0)
Bicarbonate: 22.6 mEq/L (ref 20.0–24.0)
O2 Saturation: 95 %
O2 Saturation: 95 %
PH ART: 7.377 (ref 7.350–7.450)
PH ART: 7.383 (ref 7.350–7.450)
Patient temperature: 37.1
TCO2: 23 mmol/L (ref 0–100)
TCO2: 24 mmol/L (ref 0–100)
pCO2 arterial: 37.6 mmHg (ref 35.0–45.0)
pCO2 arterial: 38.5 mmHg (ref 35.0–45.0)
pO2, Arterial: 77 mmHg — ABNORMAL LOW (ref 80.0–100.0)
pO2, Arterial: 78 mmHg — ABNORMAL LOW (ref 80.0–100.0)

## 2013-05-17 LAB — POCT I-STAT, CHEM 8
BUN: 18 mg/dL (ref 6–23)
Calcium, Ion: 1.14 mmol/L (ref 1.13–1.30)
Chloride: 101 mEq/L (ref 96–112)
Creatinine, Ser: 1.1 mg/dL (ref 0.50–1.35)
Glucose, Bld: 121 mg/dL — ABNORMAL HIGH (ref 70–99)
HCT: 31 % — ABNORMAL LOW (ref 39.0–52.0)
Hemoglobin: 10.5 g/dL — ABNORMAL LOW (ref 13.0–17.0)
Potassium: 4 mEq/L (ref 3.7–5.3)
Sodium: 139 mEq/L (ref 137–147)
TCO2: 23 mmol/L (ref 0–100)

## 2013-05-17 LAB — GLUCOSE, CAPILLARY
GLUCOSE-CAPILLARY: 102 mg/dL — AB (ref 70–99)
GLUCOSE-CAPILLARY: 104 mg/dL — AB (ref 70–99)
GLUCOSE-CAPILLARY: 114 mg/dL — AB (ref 70–99)
GLUCOSE-CAPILLARY: 117 mg/dL — AB (ref 70–99)
GLUCOSE-CAPILLARY: 126 mg/dL — AB (ref 70–99)
GLUCOSE-CAPILLARY: 128 mg/dL — AB (ref 70–99)
GLUCOSE-CAPILLARY: 94 mg/dL (ref 70–99)
GLUCOSE-CAPILLARY: 98 mg/dL (ref 70–99)
Glucose-Capillary: 128 mg/dL — ABNORMAL HIGH (ref 70–99)
Glucose-Capillary: 143 mg/dL — ABNORMAL HIGH (ref 70–99)
Glucose-Capillary: 114 mg/dL — ABNORMAL HIGH (ref 70–99)
Glucose-Capillary: 134 mg/dL — ABNORMAL HIGH (ref 70–99)
Glucose-Capillary: 119 mg/dL — ABNORMAL HIGH (ref 70–99)
Glucose-Capillary: 102 mg/dL — ABNORMAL HIGH (ref 70–99)
Glucose-Capillary: 111 mg/dL — ABNORMAL HIGH (ref 70–99)
Glucose-Capillary: 103 mg/dL — ABNORMAL HIGH (ref 70–99)
Glucose-Capillary: 114 mg/dL — ABNORMAL HIGH (ref 70–99)
Glucose-Capillary: 108 mg/dL — ABNORMAL HIGH (ref 70–99)
Glucose-Capillary: 124 mg/dL — ABNORMAL HIGH (ref 70–99)
Glucose-Capillary: 114 mg/dL — ABNORMAL HIGH (ref 70–99)

## 2013-05-17 LAB — BASIC METABOLIC PANEL
BUN: 16 mg/dL (ref 6–23)
CO2: 23 meq/L (ref 19–32)
Calcium: 7.7 mg/dL — ABNORMAL LOW (ref 8.4–10.5)
Chloride: 107 mEq/L (ref 96–112)
Creatinine, Ser: 0.92 mg/dL (ref 0.50–1.35)
GFR calc Af Amer: >90 mL/min (ref >90)
GFR calc non Af Amer: 84 mL/min — ABNORMAL LOW (ref >90)
GLUCOSE: 109 mg/dL — AB (ref 70–99)
Potassium: 4.1 mEq/L (ref 3.7–5.3)
SODIUM: 141 meq/L (ref 137–147)

## 2013-05-17 LAB — MAGNESIUM
Magnesium: 2.5 mg/dL (ref 1.5–2.5)
Magnesium: 2.4 mg/dL (ref 1.5–2.5)

## 2013-05-17 LAB — CREATININE, SERUM
CREATININE: 1.1 mg/dL (ref 0.50–1.35)
GFR calc non Af Amer: 67 mL/min — ABNORMAL LOW (ref >90)
GFR, EST AFRICAN AMERICAN: 77 mL/min — AB (ref >90)

## 2013-05-17 LAB — BLOOD PRODUCT ORDER (VERBAL) VERIFICATION

## 2013-05-17 MED ORDER — POTASSIUM CHLORIDE 10 MEQ/50ML IV SOLN
10.0000 meq | INTRAVENOUS | Status: AC
  Administered 2013-05-17 (×2): 10 meq via INTRAVENOUS

## 2013-05-17 MED ORDER — INSULIN ASPART 100 UNIT/ML ~~LOC~~ SOLN
0.0000 [IU] | SUBCUTANEOUS | Status: DC
  Administered 2013-05-17 – 2013-05-18 (×3): 2 [IU] via SUBCUTANEOUS

## 2013-05-17 MED ORDER — AMIODARONE HCL IN DEXTROSE 360-4.14 MG/200ML-% IV SOLN
30.0000 mg/h | INTRAVENOUS | Status: DC
  Administered 2013-05-18: 30 mg/h via INTRAVENOUS
  Filled 2013-05-17 (×4): qty 200

## 2013-05-17 MED ORDER — POTASSIUM CHLORIDE 10 MEQ/50ML IV SOLN
INTRAVENOUS | Status: AC
  Filled 2013-05-17: qty 100

## 2013-05-17 MED ORDER — VANCOMYCIN HCL IN DEXTROSE 1-5 GM/200ML-% IV SOLN
1000.0000 mg | Freq: Once | INTRAVENOUS | Status: AC
  Administered 2013-05-17: 1000 mg via INTRAVENOUS
  Filled 2013-05-17: qty 200

## 2013-05-17 MED ORDER — INSULIN DETEMIR 100 UNIT/ML ~~LOC~~ SOLN
12.0000 [IU] | Freq: Once | SUBCUTANEOUS | Status: AC
  Administered 2013-05-17: 12 [IU] via SUBCUTANEOUS
  Filled 2013-05-17: qty 0.12

## 2013-05-17 MED ORDER — FUROSEMIDE 10 MG/ML IJ SOLN
20.0000 mg | Freq: Two times a day (BID) | INTRAMUSCULAR | Status: DC
  Administered 2013-05-17 (×2): 20 mg via INTRAVENOUS
  Filled 2013-05-17 (×5): qty 2

## 2013-05-17 MED ORDER — TRAMADOL HCL 50 MG PO TABS
50.0000 mg | ORAL_TABLET | Freq: Four times a day (QID) | ORAL | Status: DC | PRN
Start: 2013-05-17 — End: 2013-05-21

## 2013-05-17 MED ORDER — INSULIN DETEMIR 100 UNIT/ML ~~LOC~~ SOLN
12.0000 [IU] | Freq: Every day | SUBCUTANEOUS | Status: DC
  Administered 2013-05-18 – 2013-05-19 (×2): 12 [IU] via SUBCUTANEOUS
  Filled 2013-05-17 (×3): qty 0.12

## 2013-05-17 MED ORDER — INSULIN DETEMIR 100 UNIT/ML ~~LOC~~ SOLN
15.0000 [IU] | Freq: Two times a day (BID) | SUBCUTANEOUS | Status: DC
Start: 2013-05-17 — End: 2013-05-17
  Filled 2013-05-17: qty 0.15

## 2013-05-17 MED ORDER — AMIODARONE HCL IN DEXTROSE 360-4.14 MG/200ML-% IV SOLN
60.0000 mg/h | INTRAVENOUS | Status: AC
  Administered 2013-05-17 (×2): 60 mg/h via INTRAVENOUS
  Filled 2013-05-17: qty 200

## 2013-05-17 MED ORDER — AMIODARONE LOAD VIA INFUSION
150.0000 mg | Freq: Once | INTRAVENOUS | Status: AC
  Administered 2013-05-17: 150 mg via INTRAVENOUS
  Filled 2013-05-17: qty 83.34

## 2013-05-17 MED FILL — Potassium Chloride Inj 2 mEq/ML: INTRAVENOUS | Qty: 40 | Status: AC

## 2013-05-17 MED FILL — Magnesium Sulfate Inj 50%: INTRAMUSCULAR | Qty: 10 | Status: AC

## 2013-05-17 MED FILL — Heparin Sodium (Porcine) Inj 1000 Unit/ML: INTRAMUSCULAR | Qty: 30 | Status: AC

## 2013-05-17 NOTE — Progress Notes (Addendum)
T[facility] DAILY ICU PROGRESS NOTE                   [address]            [place], [address]          [phone]   1 Day Post-Op Procedure(s) (LRB): CORONARY ARTERY BYPASS GRAFTING (CABG) (N/A) INTRAOPERATIVE TRANSESOPHAGEAL ECHOCARDIOGRAM (N/A)  Total Length of Stay:  LOS: 6 days   Subjective: Some soreness  Objective: Vital signs in last 24 hours: Temp:  [95.7 F (35.4 C)-100.9 F (38.3 C)] 97.2 F (36.2 C) (03/03 0700) Pulse Rate:  [67-97] 80 (03/03 0700) Cardiac Rhythm:  [-] Normal sinus rhythm (03/03 0730) Resp:  [11-26] 15 (03/03 0700) BP: (83-127)/(48-75) 99/58 mmHg (03/03 0700) SpO2:  [95 %-100 %] 99 % (03/03 0700) Arterial Line BP: (94-275)/(46-67) 135/60 mmHg (03/03 0700) FiO2 (%):  [40 %-60 %] 40 % (03/03 0013) Weight:  [207 lb 14.3 oz (94.3 kg)] 207 lb 14.3 oz (94.3 kg) (03/03 0500)  Filed Weights   05/14/13 0615 05/16/13 0500 05/17/13 0500  Weight: 207 lb 14.3 oz (94.3 kg) 205 lb 0.4 oz (93 kg) 207 lb 14.3 oz (94.3 kg)    Weight change: 2 lb 13.9 oz (1.3 kg)   Hemodynamic parameters for last 24 hours: PAP: (19-38)/(8-24) 32/19 mmHg CO:  [3.5 L/min-6.1 L/min] 5.3 L/min CI:  [1.7 L/min/m2-3 L/min/m2] 2.5 L/min/m2  Intake/Output from previous day: 03/02 0701 - 03/03 0700 In: 7276 [P.O.:30; I.V.:5546; Blood:700; IV Piggyback:1000] Out: 9678 [Urine:2885; Chest Tube:270]  Intake/Output this shift:    Current Meds: Scheduled Meds: . acetaminophen  1,000 mg Oral 4 times per day   Or  . acetaminophen (TYLENOL) oral liquid 160 mg/5 mL  1,000 mg Per Tube 4 times per day  . aspirin EC  325 mg Oral Daily   Or  . aspirin  324 mg Per Tube Daily  . bisacodyl  10 mg Oral Daily   Or  . bisacodyl  10 mg Rectal Daily  . cefUROXime (ZINACEF)  IV  1.5 g Intravenous Q12H  . cycloSPORINE  100 mg Oral Daily  . docusate sodium  200 mg Oral Daily  . insulin regular  0-10 Units Intravenous TID WC  . metoprolol tartrate  12.5 mg Oral BID   Or  .  metoprolol tartrate  12.5 mg Per Tube BID  . [START ON 05/18/2013] pantoprazole  40 mg Oral Daily  . rosuvastatin  5 mg Oral q1800  . sodium chloride  3 mL Intravenous Q12H   Continuous Infusions: . sodium chloride 20 mL/hr at 05/16/13 2000  . sodium chloride 20 mL/hr at 05/16/13 1800  . sodium chloride    . dexmedetomidine Stopped (05/17/13 0000)  . DOPamine Stopped (05/17/13 0700)  . insulin (NOVOLIN-R) infusion 2.6 Units/hr (05/17/13 0700)  . lactated ringers 20 mL/hr at 05/16/13 1800  . milrinone 0.3 mcg/kg/min (05/17/13 0700)  . nitroGLYCERIN Stopped (05/16/13 1300)  . phenylephrine (NEO-SYNEPHRINE) Adult infusion Stopped (05/17/13 0300)   PRN Meds:.albumin human, metoprolol, midazolam, morphine injection, ondansetron (ZOFRAN) IV, oxyCODONE, sodium chloride  General appearance: alert, cooperative and no distress Heart: regular rate and rhythm and + rub with CT in place Lungs: clear anteriorly Abdomen: soft, non-tender Extremities: mild edema Wound: incis - dressings CDI  Lab Results: CBC: Recent Labs  05/16/13 1900 05/16/13 1940 05/17/13 0330  WBC 10.1  --  9.5  HGB 10.9* 10.5* 10.1*  HCT 31.0* 31.0* 28.2*  PLT 157  --  134*   BMET:  Recent Labs  05/16/13 0343  05/16/13 1940 05/17/13 0330  NA 134*  < > 139 141  K 4.6  < > 4.3 4.1  CL 101  --  104 107  CO2 21  --   --  23  GLUCOSE 105*  < > 117* 109*  BUN 21  --  15 16  CREATININE 1.01  < > 0.90 0.92  CALCIUM 8.5  --   --  7.7*  < > = values in this interval not displayed.  PT/INR:  Recent Labs  05/16/13 1300  LABPROT 16.3*  INR 1.34   ABG    Component Value Date/Time   PHART 7.377 05/17/2013 0134   PCO2ART 38.5 05/17/2013 0134   PO2ART 77.0* 05/17/2013 0134   HCO3 22.6 05/17/2013 0134   TCO2 24 05/17/2013 0134   ACIDBASEDEF 2.0 05/17/2013 0134   O2SAT 95.0 05/17/2013 0134    Radiology: Dg Chest Portable 1 View  05/16/2013   CLINICAL DATA:  Postop CABG.  EXAM: PORTABLE CHEST - 1 VIEW  COMPARISON:  DG CHEST  2 VIEW dated 05/13/2013; CT CHEST W/O CM dated 05/13/2013  FINDINGS: 1313 hr. Due to overlap with the additional support system, the tip of the endotracheal tube is not well visualized. A nasogastric tube projects below the diaphragm. Right IJ Swan-Ganz catheter tip is in the proximal right pulmonary artery. Left chest tube is in place. The heart size and mediastinal contours are stable status post interval median sternotomy and CABG. The lungs are nearly clear aside from mild basilar atelectasis. Old rib fractures are present on the left. There is no pneumothorax.  IMPRESSION: 1. No demonstrated complication following CABG. There is mild bibasilar atelectasis. 2. The endotracheal tube tip is not well visualized.   Electronically Signed   By: Camie Patience M.D.   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 [name],PETER 05/17/2013

## 2013-05-17 NOTE — Progress Notes (Signed)
CT Surg pm rounds  Dev A-fib this pm- iv amio started, will use low dose due to interaction with Cy A Was able to wailk in hall  CBG hi- BID Levemir ordered

## 2013-05-17 NOTE — Procedures (Signed)
Extubation Procedure Note  Patient Details:   Name: GRACESON NICHELSON DOB: Mar 07, 1946 MRN: [id number]   Airway Documentation:     Evaluation  O2 sats: stable throughout Complications: No apparent complications Patient did tolerate procedure well. Bilateral Breath Sounds: Clear;Diminished Suctioning: Airway (no suction at this time.) Yes NIF -20 VC 1.2  Ailsa Mireles 05/17/2013, 12:43 AM

## 2013-05-17 NOTE — Progress Notes (Signed)
Inpatient Diabetes Program Recommendations  AACE/ADA: New Consensus Statement on Inpatient Glycemic Control (2013)  Target Ranges:  Prepandial:   less than 140 mg/dL      Peak postprandial:   less than 180 mg/dL (1-2 hours)      Critically ill patients:  140 - 180 mg/dL   Reason for Assessment:Results for DOYEL, MULKERN (MRN [id number]) as of 05/17/2013 10:46  Ref. Range 05/13/2013 00:11  Hemoglobin A1C Latest Range: <5.7 % 8.9 (H)   Diabetes history: Type 2 diabetes Outpatient Diabetes medications: Amaryl 4 mg daily Current orders for Inpatient glycemic control:  Patient transitioning off insulin drip to Novolog (TCTS scale) q 4 hours and Levemir 12 units daily  Note that A1C elevated.  Will likely need intensification of outpatient diabetes regimen which may include basal insulin?  Will follow.  Adah Perl, RN, BC-ADM Inpatient Diabetes Coordinator Pager [phone]

## 2013-05-17 NOTE — Progress Notes (Signed)
Subjective:  Day 1 s/p CABG x2 with LIMA-LAD and SVG- PD  Objective:   Vital Signs in the last 24 hours: Temp:  [97.2 F (36.2 C)-100.9 F (38.3 C)] 98.1 F (36.7 C) (03/03 1552) Pulse Rate:  [67-94] 77 (03/03 1600) Resp:  [10-23] 18 (03/03 1600) BP: (83-118)/(48-69) 110/67 mmHg (03/03 1600) SpO2:  [95 %-100 %] 99 % (03/03 1600) Arterial Line BP: (94-160)/(50-69) 149/62 mmHg (03/03 1300) FiO2 (%):  [40 %-60 %] 40 % (03/03 0013) Weight:  [207 lb 14.3 oz (94.3 kg)] 207 lb 14.3 oz (94.3 kg) (03/03 0500)  Intake/Output from previous day: 03/02 0701 - 03/03 0700 In: 7276 [P.O.:30; I.V.:5546; Blood:700; IV Piggyback:1000] Out: 9833 [Urine:2885; Chest Tube:270]  Medications: . acetaminophen  1,000 mg Oral 4 times per day   Or  . acetaminophen (TYLENOL) oral liquid 160 mg/5 mL  1,000 mg Per Tube 4 times per day  . aspirin EC  325 mg Oral Daily   Or  . aspirin  324 mg Per Tube Daily  . bisacodyl  10 mg Oral Daily   Or  . bisacodyl  10 mg Rectal Daily  . cefUROXime (ZINACEF)  IV  1.5 g Intravenous Q12H  . cycloSPORINE  100 mg Oral Daily  . docusate sodium  200 mg Oral Daily  . furosemide  20 mg Intravenous BID  . insulin aspart  0-24 Units Subcutaneous 6 times per day  . insulin detemir  15 Units Subcutaneous BID  . metoprolol tartrate  12.5 mg Oral BID   Or  . metoprolol tartrate  12.5 mg Per Tube BID  . [START ON 05/18/2013] pantoprazole  40 mg Oral Daily  . rosuvastatin  5 mg Oral q1800  . sodium chloride  3 mL Intravenous Q12H    . sodium chloride 20 mL/hr at 05/16/13 2000  . sodium chloride 20 mL/hr at 05/16/13 1800  . sodium chloride    . insulin (NOVOLIN-R) infusion Stopped (05/17/13 1400)  . lactated ringers 20 mL/hr at 05/16/13 1800  . nitroGLYCERIN Stopped (05/16/13 1300)    Physical Exam:   General appearance: alert, cooperative and no distress Neck: no JVD, supple, symmetrical, trachea midline and thyroid not enlarged, symmetric, no  tenderness/mass/nodules Lungs: slightly decreased BS at bases Heart: regular rate and rhythm, S1, S2 normal and no rub Abdomen: soft, non-tender; bowel sounds normal; no masses,  no organomegaly Extremities: no significant edema Neurologic: Grossly normal   Rate: 87  Rhythm: normal sinus rhythm  Lab Results:    Recent Labs  05/16/13 0343  05/17/13 0330 05/17/13 1631  NA 134*  < > 141 139  K 4.6  < > 4.1 4.0  CL 101  < > 107 101  CO2 21  --  23  --   GLUCOSE 105*  < > 109* 121*  BUN 21  < > 16 18  CREATININE 1.01  < > 0.92 1.10  < > = values in this interval not displayed. No results found for this basename: TROPONINI, CK, MB,  in the last 72 hours Hepatic Function Panel No results found for this basename: PROT, ALBUMIN, AST, ALT, ALKPHOS, BILITOT, BILIDIR, IBILI,  in the last 72 hours  Recent Labs  05/16/13 1300  INR 1.34   BNP (last 3 results) No results found for this basename: PROBNP,  in the last 8760 hours  Lipid Panel     Component Value Date/Time   CHOL 119 05/12/2013 0043   TRIG 131 05/12/2013 0043   HDL  15* 05/12/2013 0043   CHOLHDL 7.9 05/12/2013 0043   VLDL 26 05/12/2013 0043   LDLCALC 78 05/12/2013 0043    ECG: NSR at 76   Imaging:  Dg Chest Portable 1 View In Am  05/17/2013   CLINICAL DATA:  Coronary artery disease  EXAM: PORTABLE CHEST - 1 VIEW  COMPARISON:  May 16, 2013  FINDINGS: Endotracheal tube and nasogastric tube have been removed. Swan-Ganz catheter tip is in the right main pulmonary artery. There is a persistent mediastinal drain and a left chest tube. Temporary pacemaker wires are present. There is a persistent minimal right apical pneumothorax.  There is new focal consolidation in the right base. Elsewhere lungs are clear. Heart is mildly enlarged with normal pulmonary vascularity.  IMPRESSION: Tube and catheter positions as described. There is a stable minimal right apical pneumothorax. New focus of consolidation right base.   Electronically  Signed   By: Lowella Grip M.D.   On: 05/17/2013 08:22   Dg Chest Portable 1 View  05/16/2013   CLINICAL DATA:  Postop CABG.  EXAM: PORTABLE CHEST - 1 VIEW  COMPARISON:  DG CHEST 2 VIEW dated 05/13/2013; CT CHEST W/O CM dated 05/13/2013  FINDINGS: 1313 hr. Due to overlap with the additional support system, the tip of the endotracheal tube is not well visualized. A nasogastric tube projects below the diaphragm. Right IJ Swan-Ganz catheter tip is in the proximal right pulmonary artery. Left chest tube is in place. The heart size and mediastinal contours are stable status post interval median sternotomy and CABG. The lungs are nearly clear aside from mild basilar atelectasis. Old rib fractures are present on the left. There is no pneumothorax.  IMPRESSION: 1. No demonstrated complication following CABG. There is mild bibasilar atelectasis. 2. The endotracheal tube tip is not well visualized.   Electronically Signed   By: Camie Patience M.D.   On: 05/16/2013 13:40      Assessment/Plan:   Principal Problem:   NSTEMI (non-ST elevated myocardial infarction) Active Problems:   Diabetes mellitus   Dyslipidemia   HTN (hypertension)   Rheumatoid arthritis   S/P CABG x 2  I/O + 178 today; +7296 since admission 2/25. Hemodynamics stable. No ectopy. Maintaining sinus rhythm. Receiving IV Lasix for mild volume overload.    Troy Sine, MD, [facility] 05/17/2013, 4:56 PM

## 2013-05-17 NOTE — Progress Notes (Signed)
Pt flipped to cpap/ps for wean.  No complications noted at this time.  Rt will continue to monitor for extubation.

## 2013-05-17 NOTE — Care Management Note (Signed)
    Page 1 of 1   05/17/2013     11:17:04 AM   CARE MANAGEMENT NOTE 05/17/2013  Patient:  Francis Wilkinson, Francis Wilkinson   Account Number:  [id number]  Date Initiated:  05/16/2013  Documentation initiated by:  [name]  Subjective/Objective Assessment:   Admitted with NSTEMI - 3-2 post op CABG x2.     Action/Plan:   Anticipated DC Date:  05/20/2013   Anticipated DC Plan:  [place]  CM consult      Choice offered to / List presented to:             Status of service:  In process, will continue to follow Medicare Important Message given?   (If response is "NO", the following Medicare IM given date fields will be blank) Date Medicare IM given:   Date Additional Medicare IM given:    Discharge Disposition:    Per UR Regulation:  Reviewed for med. necessity/level of care/duration of stay  If discussed at [place] of Stay Meetings, dates discussed:    Comments:  Contact:  Muntean,Janie Spouse [phone]   [phone]                 Higley,Sharon Daughter     [phone]  05-17-13 11:15am [name], RNBSN. Patient awake, alert in bed.  Wife at bedside.  Wife states patient lives with her and daughter.  Independent prior to surgery.  Plan to get him home with 24/7 coverage.   77 - daughter with recent foot surgery - back at work now - will be home evenings and at night.  Wife home now but may be having abd surgery next week.  however,  if this happens, the wife and pateint have assured me they have other friends/family arranged to have someone with him 24/7 .  CM will continue to follow for further needs.

## 2013-05-18 ENCOUNTER — Inpatient Hospital Stay (HOSPITAL_COMMUNITY): Payer: Medicare Other

## 2013-05-18 LAB — GLUCOSE, CAPILLARY
GLUCOSE-CAPILLARY: 104 mg/dL — AB (ref 70–99)
GLUCOSE-CAPILLARY: 117 mg/dL — AB (ref 70–99)
GLUCOSE-CAPILLARY: 129 mg/dL — AB (ref 70–99)
GLUCOSE-CAPILLARY: 197 mg/dL — AB (ref 70–99)
Glucose-Capillary: 142 mg/dL — ABNORMAL HIGH (ref 70–99)
Glucose-Capillary: 119 mg/dL — ABNORMAL HIGH (ref 70–99)
Glucose-Capillary: 258 mg/dL — ABNORMAL HIGH (ref 70–99)
Glucose-Capillary: 175 mg/dL — ABNORMAL HIGH (ref 70–99)

## 2013-05-18 LAB — CBC
HCT: 29.8 % — ABNORMAL LOW (ref 39.0–52.0)
Hemoglobin: 10.4 g/dL — ABNORMAL LOW (ref 13.0–17.0)
MCH: 32.5 pg (ref 26.0–34.0)
MCHC: 34.9 g/dL (ref 30.0–36.0)
MCV: 93.1 fL (ref 78.0–100.0)
Platelets: 174 10*3/uL (ref 150–400)
RBC: 3.2 MIL/uL — ABNORMAL LOW (ref 4.22–5.81)
RDW: 16.1 % — ABNORMAL HIGH (ref 11.5–15.5)
WBC: 13.5 10*3/uL — ABNORMAL HIGH (ref 4.0–10.5)

## 2013-05-18 LAB — BASIC METABOLIC PANEL
BUN: 23 mg/dL (ref 6–23)
CO2: 22 mEq/L (ref 19–32)
Calcium: 8 mg/dL — ABNORMAL LOW (ref 8.4–10.5)
Chloride: 104 mEq/L (ref 96–112)
Creatinine, Ser: 1.06 mg/dL (ref 0.50–1.35)
GFR calc Af Amer: 81 mL/min — ABNORMAL LOW (ref >90)
GFR calc non Af Amer: 70 mL/min — ABNORMAL LOW (ref >90)
Glucose, Bld: 131 mg/dL — ABNORMAL HIGH (ref 70–99)
Potassium: 4.7 mEq/L (ref 3.7–5.3)
Sodium: 139 mEq/L (ref 137–147)

## 2013-05-18 MED ORDER — MOVING RIGHT ALONG BOOK
Freq: Once | Status: AC
  Administered 2013-05-18: 11:00:00
  Filled 2013-05-18: qty 1

## 2013-05-18 MED ORDER — MAGNESIUM HYDROXIDE 400 MG/5ML PO SUSP: 30.0000 mL | Freq: Every day | ORAL | Status: DC | PRN

## 2013-05-18 MED ORDER — SODIUM CHLORIDE 0.9 % IV SOLN: 250.0000 mL | INTRAVENOUS | Status: DC | PRN

## 2013-05-18 MED ORDER — AMIODARONE IV BOLUS ONLY 150 MG/100ML: 150.0000 mg | Freq: Once | INTRAVENOUS | Status: DC

## 2013-05-18 MED ORDER — AMIODARONE LOAD VIA INFUSION
150.0000 mg | Freq: Once | INTRAVENOUS | Status: AC
  Administered 2013-05-18: 150 mg via INTRAVENOUS
  Filled 2013-05-18: qty 83.34

## 2013-05-18 MED ORDER — SODIUM CHLORIDE 0.9 % IJ SOLN: 3.0000 mL | INTRAMUSCULAR | Status: DC | PRN

## 2013-05-18 MED ORDER — GUAIFENESIN ER 600 MG PO TB12
600.0000 mg | ORAL_TABLET | Freq: Two times a day (BID) | ORAL | Status: DC | PRN
  Filled 2013-05-18: qty 1

## 2013-05-18 MED ORDER — SODIUM CHLORIDE 0.9 % IJ SOLN
3.0000 mL | Freq: Two times a day (BID) | INTRAMUSCULAR | Status: DC
  Administered 2013-05-18: 10 mL via INTRAVENOUS
  Administered 2013-05-18 – 2013-05-20 (×2): 3 mL via INTRAVENOUS

## 2013-05-18 MED ORDER — FUROSEMIDE 10 MG/ML IJ SOLN
40.0000 mg | Freq: Every day | INTRAMUSCULAR | Status: DC
  Administered 2013-05-18: 40 mg via INTRAVENOUS
  Filled 2013-05-18: qty 4

## 2013-05-18 MED ORDER — LISINOPRIL 10 MG PO TABS
10.0000 mg | ORAL_TABLET | Freq: Every day | ORAL | Status: DC
  Administered 2013-05-18 – 2013-05-21 (×4): 10 mg via ORAL
  Filled 2013-05-18 (×4): qty 1

## 2013-05-18 MED ORDER — INSULIN ASPART 100 UNIT/ML ~~LOC~~ SOLN
0.0000 [IU] | Freq: Three times a day (TID) | SUBCUTANEOUS | Status: DC
  Administered 2013-05-18 (×2): 4 [IU] via SUBCUTANEOUS
  Administered 2013-05-18: 8 [IU] via SUBCUTANEOUS
  Administered 2013-05-19: 4 [IU] via SUBCUTANEOUS
  Administered 2013-05-19 – 2013-05-20 (×2): 2 [IU] via SUBCUTANEOUS

## 2013-05-18 MED ORDER — AMIODARONE HCL 200 MG PO TABS
200.0000 mg | ORAL_TABLET | Freq: Two times a day (BID) | ORAL | Status: DC
  Administered 2013-05-18 – 2013-05-21 (×7): 200 mg via ORAL
  Filled 2013-05-18 (×8): qty 1

## 2013-05-18 MED ORDER — SODIUM CHLORIDE 0.9 % IJ SOLN
10.0000 mL | Freq: Two times a day (BID) | INTRAMUSCULAR | Status: DC
  Administered 2013-05-18 – 2013-05-20 (×3): 10 mL via INTRAVENOUS

## 2013-05-18 NOTE — Progress Notes (Signed)
Per Dr. Prescott Gum, CT to H2O seal.

## 2013-05-18 NOTE — Op Note (Signed)
NAMEHAMMOND, Francis Wilkinson NO.:  [id number]  MEDICAL RECORD NO.:  [id number]  LOCATION:  [id number]                        FACILITY:  [facility]  PHYSICIAN:  [name], M.D.  DATE OF BIRTH:  10-22-1945  DATE OF PROCEDURE:  05/17/2013 DATE OF DISCHARGE:                              OPERATIVE REPORT   OPERATION: 1. Coronary artery bypass grafting x2 (left internal mammary artery to     left anterior descending, saphenous vein graft to posterior     descending). 2. Endoscopic harvest of right leg greater saphenous vein. 3. Biopsy of left upper lobe.  SURGEON:  [name], M.D.  ASSISTANT:  Suzzanne Cloud, PA.  PREOPERATIVE DIAGNOSES: 1. Multivessel coronary artery disease with unstable angina. 2. Mild mitral regurgitation. 3. Pulmonary hypertension. 4. 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  ANESTHESIA:  General.  INDICATIONS:  The patient is a 68 year old Caucasian male with advanced rheumatoid arthritis, on systemic immunosuppression therapy as well as methotrexate.  He presented with unstable angina and had positive cardiac enzymes.  Cardiac catheterization demonstrated a proximal 95% LAD stenosis and a proximal 90% RCA stenosis.  The circumflex was a small nondominant vessel.  EF was 45% with some anterior apical hypokinesia and echocardiogram showed mild-to-moderate mitral regurgitation without structural abnormality in the mitral valve.  The patient was felt to be candidate for surgical coronary revascularization based on his history of unstable angina and severe ostial LAD stenosis.  Prior to surgery, I discussed the results of his cardiac catheterization and 2D echo with the patient and his wife.  I discussed the indications and expected benefits of coronary artery bypass grafting for treatment of his coronary artery disease.  I discussed the potential for the patient to require mitral valve repair-replacement if the mitral  valve regurgitation at the time surgery is felt to be significant and warranted surgical treatment.  I discussed the major aspects of the operation including the location of the surgical incisions, the use of general anesthesia and cardiopulmonary bypass, and the expected postoperative hospital recovery.  I discussed with the patient and the wife the specific risks associated with coronary artery bypass surgery including risks of MI, stroke, bleeding, blood transfusion requirement, arrhythmias, postoperative pleural effusions, postoperative pleural and pulmonary insufficiency requiring intubation, and death.  After reviewing these issues, he demonstrated his understanding and agreed to proceed with surgery under what I felt was an informed consent.  OPERATIVE FINDINGS: 1. Severe calcified coronary disease. 2. Distal circumflex and distal RCA vessels too small to graft and     atrophic. 3. Only mild mitral regurgitation by TEE with an EOA of 0.15. 4. Small by echo quality saphenous vein.  OPERATIVE PROCEDURE:  The patient was brought to the operating room and placed supine on the operating table, general anesthesia was induced under invasive hemodynamic monitoring.  A transesophageal echo probe was placed by the anesthesiologist.  The patient was prepped and draped as a sterile field.  A proper time-out was performed.  A sternal incision was made as the saphenous vein was harvested endoscopically from the right leg.  The left internal mammary artery was harvested as a pedicle graft from its origin at  the subclavian vessels.  It was 1.5-mm vessel with good flow.  The sternal retractor was placed and the pericardium was opened and suspended.  The ascending aorta was inspected, palpated, and examined.  There was no significant calcified plaque.  Pursestrings were placed in the ascending aorta and right atrium and after the heparin was administered and ACT was documented as being  therapeutic, the patient was cannulated and placed on cardiopulmonary bypass.  The coronary arteries were identified for grafting and cardioplegia cannulas were placed both antegrade and retrograde cold blood cardioplegia.  The mammary artery and vein grafts were prepared for the distal anastomoses. The patient was cooled to 32 degrees.  The aortic crossclamp was applied.  1 L of cold blood cardioplegia was delivered in split doses between the antegrade aortic and retrograde coronary sinus catheters.  There was good cardioplegic arrest and septal temperature dropped to less than 15 degrees.  Cardioplegia was delivered every 20 minutes or less.  The distal coronary anastomoses were performed.  The first distal anastomosis was the posterior descending branch of the right coronary. It was a 1.5-mm vessel with proximal 90% stenosis.  A reverse saphenous vein was sewn end-to-side with running 7-0 Prolene with excellent flow through the graft.  Cardioplegia was redosed.  A second distal anastomosis was to the LAD-diagonal bifurcation.  The LAD itself was deeply intramyocardial.  The LAD-diagonal bifurcation was 1.6 to 1.7-mm vessel.  The left IMA pedicle was brought through an opening, brought through the lateral pericardium, was brought down onto the LAD and sewn end-to-side with running 8-0 Prolene.  There was excellent flow through the anastomosis after briefly releasing the pedicle bulldog on the mammary artery.  The bulldog was reapplied to the mammary pedicle and the pedicle was secured to the epicardium with 6-0 Prolene.  Cardioplegia was redosed.  While the crossclamp was still in place, a single proximal vein anastomosis was constructed on the ascending aorta with a 4.0 mm punch running 6-0 Prolene.  Prior to tying down the final proximal anastomosis, air was vented from the coronaries with a dose of retrograde warm blood cardioplegia.  The crossclamp was removed and the heart  reperfused.  The heart resumed a spontaneous rhythm.  The vein graft was de-aired and the grafts were open, each had good flow and hemostasis was documented at the proximal and distal anastomoses.  The cardioplegia cannula was removed and the patient was rewarmed and reperfused.  Temporary pacing wires were applied.  When the patient was fully rewarmed, the lungs re-expanded, the ventilator was resumed.  It should be noted that while the crossclamp was in place, a biopsy of the left upper lobe was performed with the Endo-GIA stapling device. The lungs had a significantly diffusely inflamed appearance and it was felt that there was a good likelihood that the patient had rheumatoid lung disease, so a biopsy was performed while on cardiopulmonary bypass.  Lungs re-expanded and the ventilator was resumed.  The patient was then weaned off cardiopulmonary bypass.  The patient was noted to have significant pulmonary hypertension pre-bypass with PA pressures of 60/30, so he was weaned off bypass on low-dose milrinone and inhaled nitric oxide.  His PA pressures on this regimen were approximately 35/15.  The patient was weaned off cardiopulmonary bypass without difficulty. The echo showed improved global LV function.  The echo showed trace to mild mitral regurgitation.  Protamine was administered without adverse reaction and the cannula was removed.  The mediastinum was irrigated with warm saline.  The superior pericardial fat was closed over the aorta.  Anterior mediastinal left pleural chest tube were placed and brought out through separate incisions.  The sternum was closed with wire.  The pectoralis fascia was closed with running #1 Vicryl.  The subcutaneous and skin layers were closed with a running Vicryl.  Total cardiopulmonary bypass time was 88 minutes.     [name], M.D.     PV/MEDQ  D:  05/17/2013  T:  05/18/2013  Job:  [id number]  cc:   Rolland Porter,  [facility]

## 2013-05-18 NOTE — Progress Notes (Signed)
1500 Cardiac Rehab Attempted to get pt to ambulate. He declines states that he feels clammy and that he can not walk now. "I will do better tomorrow." I encouraged pt to try later to walk. Reported to RN. Deon Pilling, RN 05/18/2013 3:15 PM

## 2013-05-18 NOTE — Evaluation (Signed)
Physical Therapy Evaluation Patient Details Name: Francis Wilkinson MRN: [id number] DOB: Apr 25, 1945 Today's Date: 05/18/2013 Time: 2158-7276 PT Time Calculation (min): 25 min  PT Assessment / Plan / Recommendation History of Present Illness  68 y/o from [place] with no prior history of CAD. He does have diabetes, HTN, dyslipidemia. Today after lunch he was carrying groceries in when he developed Lt sided chest pain associated with SOB, diaphoresis, and nausea. +NSTEMI and underwent CABG x2 3/2. PMHx - DM, RA (mostly affecting hands), HTN  Clinical Impression  Patient is s/p CABG surgery resulting in functional limitations due to the deficits listed below (see PT Problem List). Has RA with arthritic hands, however was very independent PTA. Patient will benefit from skilled PT to increase their independence and safety with mobility to allow discharge to the venue listed below.       PT Assessment  Patient needs continued PT services    Follow Up Recommendations  No PT follow up;Supervision/Assistance - 24 hour    Does the patient have the potential to tolerate intense rehabilitation      Barriers to Discharge        Equipment Recommendations  None recommended by PT    Recommendations for Other Services     Frequency Min 3X/week    Precautions / Restrictions Precautions Precautions: Sternal Restrictions Weight Bearing Restrictions: No Other Position/Activity Restrictions: sternal precautions   Pertinent Vitals/Pain HR 82 SR, while walking incr to 140 and RN reported went back into afib. SaO2 97% on 2L      Mobility  Bed Mobility Overal bed mobility: Needs Assistance Bed Mobility: Sit to Sidelying Sit to sidelying: Mod assist General bed mobility comments: vc for technique to maintain sternal precautions; assist to direct torso and lift legs Transfers Overall transfer level: Needs assistance Equipment used: Pushed w/c Transfers: Sit to/from Stand Sit to Stand: Min  assist General transfer comment: vc for technique to maintain sternal precautions; assist to weight shift forward over BOS Ambulation/Gait Ambulation/Gait assistance: Min assist;+2 safety/equipment Ambulation Distance (Feet): 200 Feet Assistive device:  (push w/c) Gait Pattern/deviations: Step-through pattern Gait velocity: decr "I walk slow enough so I'd have to speed up to fall"    Exercises     PT Diagnosis: Difficulty walking  PT Problem List: Decreased activity tolerance;Decreased balance;Decreased mobility;Decreased knowledge of use of DME;Decreased knowledge of precautions;Cardiopulmonary status limiting activity;Pain PT Treatment Interventions: DME instruction;Gait training;Functional mobility training;Therapeutic activities;Patient/family education     PT Goals(Current goals can be found in the care plan section) Acute Rehab PT Goals Patient Stated Goal: be able to care for himself again PT Goal Formulation: With patient Time For Goal Achievement: 05/25/13 Potential to Achieve Goals: Good  Visit Information  Last PT Received On: 05/18/13 Assistance Needed: +1 History of Present Illness: 68 y/o from [place] with no prior history of CAD. He does have diabetes, HTN, dyslipidemia. Today after lunch he was carrying groceries in when he developed Lt sided chest pain associated with SOB, diaphoresis, and nausea. +NSTEMI and underwent CABG x2 3/2. 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    Solicitor Arousal/Alertness: Awake/alert Behavior During Therapy: WFL for tasks assessed/performed Overall Cognitive Status: Within Functional Limits for tasks assessed    Extremity/Trunk Assessment Upper Extremity Assessment Upper Extremity Assessment: RUE deficits/detail;LUE deficits/detail RUE Deficits / Details: arthritic changes in hands LUE Deficits / Details: arthritic changes in hands Lower Extremity Assessment Lower Extremity Assessment: Overall WFL for tasks assessed Cervical / Trunk Assessment Cervical / Trunk Assessment: Normal   Balance Balance Overall balance assessment: Needs assistance Sitting-balance support: No upper extremity supported;Feet supported Sitting balance-Leahy Scale: Good Standing balance support: No upper extremity supported Standing balance-Leahy Scale: Fair Standing balance comment: able to accept minimal perturbations without UE support  End of Session PT - End of Session Equipment Utilized During Treatment: Gait belt;Oxygen Activity Tolerance: Patient tolerated treatment well Patient left: in bed;with call bell/phone within reach;with nursing/sitter in room Nurse Communication: Mobility status;Other (comment) (HR elevated with ambulation)  GP     Francis Wilkinson 05/18/2013, 10:41 AM Pager [phone]

## 2013-05-19 ENCOUNTER — Inpatient Hospital Stay (HOSPITAL_COMMUNITY): Payer: Medicare Other

## 2013-05-19 ENCOUNTER — Encounter (HOSPITAL_COMMUNITY): Payer: Self-pay

## 2013-05-19 DIAGNOSIS — I4891 Unspecified atrial fibrillation: Secondary | ICD-10-CM

## 2013-05-19 DIAGNOSIS — M069 Rheumatoid arthritis, unspecified: Secondary | ICD-10-CM

## 2013-05-19 DIAGNOSIS — R609 Edema, unspecified: Secondary | ICD-10-CM

## 2013-05-19 LAB — GLUCOSE, CAPILLARY
GLUCOSE-CAPILLARY: 126 mg/dL — AB (ref 70–99)
GLUCOSE-CAPILLARY: 133 mg/dL — AB (ref 70–99)
Glucose-Capillary: 105 mg/dL — ABNORMAL HIGH (ref 70–99)
Glucose-Capillary: 178 mg/dL — ABNORMAL HIGH (ref 70–99)

## 2013-05-19 LAB — CBC
HCT: 33.5 % — ABNORMAL LOW (ref 39.0–52.0)
Hemoglobin: 11.3 g/dL — ABNORMAL LOW (ref 13.0–17.0)
MCH: 31.7 pg (ref 26.0–34.0)
MCHC: 33.7 g/dL (ref 30.0–36.0)
MCV: 94.1 fL (ref 78.0–100.0)
Platelets: 226 10*3/uL (ref 150–400)
RBC: 3.56 MIL/uL — ABNORMAL LOW (ref 4.22–5.81)
RDW: 16.1 % — ABNORMAL HIGH (ref 11.5–15.5)
WBC: 12.3 10*3/uL — ABNORMAL HIGH (ref 4.0–10.5)

## 2013-05-19 LAB — BASIC METABOLIC PANEL
BUN: 26 mg/dL — ABNORMAL HIGH (ref 6–23)
CO2: 24 mEq/L (ref 19–32)
Calcium: 8.5 mg/dL (ref 8.4–10.5)
Chloride: 102 mEq/L (ref 96–112)
Creatinine, Ser: 1.03 mg/dL (ref 0.50–1.35)
GFR calc Af Amer: 84 mL/min — ABNORMAL LOW (ref >90)
GFR calc non Af Amer: 72 mL/min — ABNORMAL LOW (ref >90)
Glucose, Bld: 109 mg/dL — ABNORMAL HIGH (ref 70–99)
Potassium: 4.6 mEq/L (ref 3.7–5.3)
Sodium: 139 mEq/L (ref 137–147)

## 2013-05-19 LAB — TYPE AND SCREEN
ABO/RH(D): O NEG
ANTIBODY SCREEN: NEGATIVE
UNIT DIVISION: 0
Unit division: 0
Unit division: 0
Unit division: 0

## 2013-05-19 MED ORDER — GLIMEPIRIDE 4 MG PO TABS
4.0000 mg | ORAL_TABLET | Freq: Every day | ORAL | Status: DC
  Administered 2013-05-20 – 2013-05-21 (×2): 4 mg via ORAL
  Filled 2013-05-19 (×3): qty 1

## 2013-05-19 MED ORDER — METOPROLOL TARTRATE 25 MG/10 ML ORAL SUSPENSION
25.0000 mg | Freq: Two times a day (BID) | ORAL | Status: DC
  Administered 2013-05-19 – 2013-05-20 (×2): 25 mg
  Filled 2013-05-19 (×6): qty 10

## 2013-05-19 MED ORDER — FUROSEMIDE 40 MG PO TABS
40.0000 mg | ORAL_TABLET | Freq: Every day | ORAL | Status: DC
  Administered 2013-05-19 – 2013-05-21 (×3): 40 mg via ORAL
  Filled 2013-05-19 (×3): qty 1

## 2013-05-19 MED ORDER — ENOXAPARIN SODIUM 40 MG/0.4ML ~~LOC~~ SOLN
40.0000 mg | SUBCUTANEOUS | Status: DC
  Administered 2013-05-19 – 2013-05-20 (×2): 40 mg via SUBCUTANEOUS
  Filled 2013-05-19 (×3): qty 0.4

## 2013-05-19 MED ORDER — METOPROLOL TARTRATE 25 MG PO TABS
25.0000 mg | ORAL_TABLET | Freq: Two times a day (BID) | ORAL | Status: DC
  Administered 2013-05-19 – 2013-05-21 (×3): 25 mg via ORAL
  Filled 2013-05-19 (×6): qty 1

## 2013-05-19 NOTE — Progress Notes (Addendum)
      [address]       [place] [address]             [phone]        3 Days Post-Op Procedure(s) (LRB): CORONARY ARTERY BYPASS GRAFTING (CABG) (N/A) INTRAOPERATIVE TRANSESOPHAGEAL ECHOCARDIOGRAM (N/A)  Subjective: Patient just finished eating breakfast. No specific complaints.  Objective: Vital signs in last 24 hours: Temp:  [97.7 F (36.5 C)-98.2 F (36.8 C)] 97.7 F (36.5 C) (03/05 0325) Pulse Rate:  [76-97] 76 (03/05 0325) Cardiac Rhythm:  [-] Normal sinus rhythm;Atrial fibrillation;Ventricular fibrillation (03/05 0809) Resp:  [18-22] 18 (03/05 0325) BP: (121-133)/(69-89) 133/84 mmHg (03/05 0325) SpO2:  [94 %-100 %] 95 % (03/05 0325) Weight:  [95.5 kg (210 lb 8.6 oz)] 95.5 kg (210 lb 8.6 oz) (03/05 0316)  Pre op weight 93 kg Current Weight  05/19/13 95.5 kg (210 lb 8.6 oz)      Intake/Output from previous day: 03/04 0701 - 03/05 0700 In: 710.1 [P.O.:600; I.V.:110.1] Out: 1025 [Urine:1025]   Physical Exam:  Cardiovascular: RRR, no murmurs, gallops, or rubs. Pulmonary: Clear to auscultation on right; slightly diminished at left base; no rales, wheezes, or rhonchi. Abdomen: Soft, non tender, bowel sounds present. Extremities: Mild bilateral lower extremity edema R>L Wounds: Clean and dry.  No erythema or signs of infection.  Lab Results: CBC: Recent Labs  05/18/13 0440 05/19/13 0318  WBC 13.5* 12.3*  HGB 10.4* 11.3*  HCT 29.8* 33.5*  PLT 174 226   BMET:  Recent Labs  05/18/13 0440 05/19/13 0318  NA 139 139  K 4.7 4.6  CL 104 102  CO2 22 24  GLUCOSE 131* 109*  BUN 23 26*  CREATININE 1.06 1.03  CALCIUM 8.0* 8.5    PT/INR:  Lab Results  Component Value Date   INR 1.34 05/16/2013   INR 1.17 05/11/2013   ABG:  INR: Will add last result for INR, ABG once components are confirmed Will add last 4 CBG results once components are confirmed  Assessment/Plan:  1. CV - Previous a fib.SR this am. On Amiodarone 200 bid,  Lopressor 12.5 bid, and Lisinopril 10 daily. 2.  Pulmonary - CXR shows Encourage incentive spirometer 3. Volume Overload - On Lasix 40 daily 4.  Acute blood loss anemia - H and H up to 11.3 and 33.5 5.DM- CBGs 197/175/105. On Insulin 12 daily. Was on Amaryl pre op. Will restart. Stop Insulin in am.Pre op HGA1C 8.9. Will consider another oral agent if glucose not well controlled.Will need follow up with medical doctor after discharge. 6. Remove EPW in am 7. Likely discharge Saturday ZIMMERMAN,DONIELLE MPA-C 05/19/2013,8:19 AM  Lung biopsy shows no evidence of rheumatoid lung disease CXR clear Plan for DC Sat  patient examined and medical record reviewed,agree with above note. [name],PETER 05/19/2013

## 2013-05-19 NOTE — Progress Notes (Signed)
CARDIAC REHAB PHASE I   PRE:  Rate/Rhythm: 80 SR    BP: sitting     SaO2: 95 RA  MODE:  Ambulation: 590 ft   POST:  Rate/Rhythm: 97 SR with PACs    BP: sitting 120/80     SaO2: 96 RA  Pt up getting ready to walk on arrival. Tolerated very well with increased distance. Steady with RW. Pt might need RW for home as his is a SW. To recliner. Plans to walk x2 more today.  [id number] Francis Wilkinson [place] CES, ACSM 05/19/2013 2:13 PM

## 2013-05-19 NOTE — Progress Notes (Signed)
Subjective:  Day 3 s/p CABG x 2  Objective:   Vital Signs in the last 24 hours: Temp:  [97.7 F (36.5 C)-98.2 F (36.8 C)] 97.7 F (36.5 C) (03/05 0325) Pulse Rate:  [76-84] 76 (03/05 0325) Resp:  [18-22] 18 (03/05 0325) BP: (121-133)/(71-89) 133/84 mmHg (03/05 0325) SpO2:  [95 %-100 %] 95 % (03/05 0325) Weight:  [210 lb 8.6 oz (95.5 kg)] 210 lb 8.6 oz (95.5 kg) (03/05 0316)  Intake/Output from previous day: 03/04 0701 - 03/05 0700 In: 710.1 [P.O.:600; I.V.:110.1] Out: 1025 [Urine:1025]  Medications: . acetaminophen  1,000 mg Oral 4 times per day   Or  . acetaminophen (TYLENOL) oral liquid 160 mg/5 mL  1,000 mg Per Tube 4 times per day  . amiodarone  200 mg Oral BID  . aspirin EC  325 mg Oral Daily   Or  . aspirin  324 mg Per Tube Daily  . bisacodyl  10 mg Oral Daily   Or  . bisacodyl  10 mg Rectal Daily  . cycloSPORINE  100 mg Oral Daily  . docusate sodium  200 mg Oral Daily  . enoxaparin (LOVENOX) injection  40 mg Subcutaneous Q24H  . furosemide  40 mg Oral Daily  . [START ON 05/20/2013] glimepiride  4 mg Oral Q breakfast  . insulin aspart  0-24 Units Subcutaneous TID AC & HS  . insulin detemir  12 Units Subcutaneous Daily  . lisinopril  10 mg Oral Daily  . metoprolol tartrate  12.5 mg Oral BID   Or  . metoprolol tartrate  12.5 mg Per Tube BID  . pantoprazole  40 mg Oral Daily  . rosuvastatin  5 mg Oral q1800  . sodium chloride  10 mL Intravenous Q12H  . sodium chloride  3 mL Intravenous Q12H  . sodium chloride  3 mL Intravenous Q12H    . sodium chloride 20 mL/hr at 05/16/13 2000  . sodium chloride 20 mL/hr at 05/17/13 1806  . sodium chloride      Physical Exam:   General appearance: alert, cooperative and no distress Neck: no JVD, supple, symmetrical, trachea midline and thyroid not enlarged, symmetric, no tenderness/mass/nodules Lungs: decreased BS L>R Heart: regular rate and rhythm Abdomen: soft, non-tender; bowel sounds normal; no masses,  no  organomegaly Extremities: mild bilateral LE edema Neurologic: Grossly normal   Rate: 86  Rhythm: normal sinus rhythm  Lab Results:    Recent Labs  05/18/13 0440 05/19/13 0318  NA 139 139  K 4.7 4.6  CL 104 102  CO2 22 24  GLUCOSE 131* 109*  BUN 23 26*  CREATININE 1.06 1.03   No results found for this basename: TROPONINI, CK, MB,  in the last 72 hours Hepatic Function Panel No results found for this basename: PROT, ALBUMIN, AST, ALT, ALKPHOS, BILITOT, BILIDIR, IBILI,  in the last 72 hours  Recent Labs  05/16/13 1300  INR 1.34   BNP (last 3 results) No results found for this basename: PROBNP,  in the last 8760 hours  Lipid Panel     Component Value Date/Time   CHOL 119 05/12/2013 0043   TRIG 131 05/12/2013 0043   HDL 15* 05/12/2013 0043   CHOLHDL 7.9 05/12/2013 0043   VLDL 26 05/12/2013 0043   LDLCALC 78 05/12/2013 0043      Imaging:  Dg Chest 2 View  05/19/2013   CLINICAL DATA:  Diabetes, hypertension  EXAM: CHEST  2 VIEW  COMPARISON:  DG CHEST 1V PORT dated 05/18/2013;  DG CHEST 1V PORT dated 05/16/2013  FINDINGS: Sternotomy wires overlie normal cardiac silhouette. Interval removal of left chest tube. No pneumothorax. Small left effusion remains. Right IJ sheath remains.  IMPRESSION: 1. Removal of left chest tube without pneumothorax. 2. Small left effusion.   Electronically Signed   By: Suzy Bouchard M.D.   On: 05/19/2013 07:16   Dg Chest Port 1 View  05/18/2013   CLINICAL DATA:  Status post CABG.  EXAM: PORTABLE CHEST - 1 VIEW  COMPARISON:  Chest x-ray 05/17/2013.  FINDINGS: Previously noted Swan-Ganz catheter has been removed. Right IJ Cordis with tip in the proximal superior vena cava is unchanged in position, as is a left-sided chest tube with tip in the lateral aspect of the upper left hemithorax. Bibasilar linear opacities are favored to reflect some subsegmental atelectasis. Small left pleural effusion. No appreciable pneumothorax identified at this time. No  definite consolidative airspace disease. Mild pulmonary venous congestion, without frank pulmonary edema. Mild enlargement of the cardiopericardial silhouette, similar to prior studies. The patient is rotated to the right on today's exam, resulting in distortion of the mediastinal contours and reduced diagnostic sensitivity and specificity for mediastinal pathology. Status post median sternotomy for CABG.  IMPRESSION: 1. Postoperative changes and support apparatus, as above. 2. Small amount of residual postoperative subsegmental atelectasis in the lung bases bilaterally. 3. Small left pleural effusion.   Electronically Signed   By: Vinnie Langton M.D.   On: 05/18/2013 08:07      Assessment/Plan:   Principal Problem:   NSTEMI (non-ST elevated myocardial infarction) Active Problems:   Diabetes mellitus   Dyslipidemia   HTN (hypertension)   Rheumatoid arthritis   S/P CABG x 2  Day 3 s/p CABG with LIMA-LAD and SVG-PD. AF this am; now in sinus rhythm. I/O -43 yesterday, but +7251 since admission. 7 lbs increased from pre-op. Continue diuresis. Will increase lopressor to 25 mg bid.     Troy Sine, MD, [facility] 05/19/2013, 9:17 AM

## 2013-05-20 LAB — GLUCOSE, CAPILLARY
GLUCOSE-CAPILLARY: 127 mg/dL — AB (ref 70–99)
GLUCOSE-CAPILLARY: 115 mg/dL — AB (ref 70–99)
Glucose-Capillary: 126 mg/dL — ABNORMAL HIGH (ref 70–99)
Glucose-Capillary: 108 mg/dL — ABNORMAL HIGH (ref 70–99)

## 2013-05-20 NOTE — Progress Notes (Signed)
Subjective:  Day 4 s/p CABG  Objective:   Vital Signs in the last 24 hours: Temp:  [98 F (36.7 C)-98.2 F (36.8 C)] 98.1 F (36.7 C) (03/06 0450) Pulse Rate:  [78-81] 79 (03/06 0450) Resp:  [18-19] 18 (03/06 0450) BP: (123-135)/(69-78) 135/76 mmHg (03/06 0450) SpO2:  [94 %-96 %] 96 % (03/06 0450) Weight:  [209 lb 3.5 oz (94.9 kg)] 209 lb 3.5 oz (94.9 kg) (03/06 0450)  Intake/Output from previous day: 03/05 0701 - 03/06 0700 In: 720 [P.O.:720] Out: -   Medications: . acetaminophen  1,000 mg Oral 4 times per day   Or  . acetaminophen (TYLENOL) oral liquid 160 mg/5 mL  1,000 mg Per Tube 4 times per day  . amiodarone  200 mg Oral BID  . aspirin EC  325 mg Oral Daily   Or  . aspirin  324 mg Per Tube Daily  . bisacodyl  10 mg Oral Daily   Or  . bisacodyl  10 mg Rectal Daily  . cycloSPORINE  100 mg Oral Daily  . docusate sodium  200 mg Oral Daily  . enoxaparin (LOVENOX) injection  40 mg Subcutaneous Q24H  . furosemide  40 mg Oral Daily  . glimepiride  4 mg Oral Q breakfast  . insulin aspart  0-24 Units Subcutaneous TID AC & HS  . lisinopril  10 mg Oral Daily  . metoprolol tartrate  25 mg Oral BID   Or  . metoprolol tartrate  25 mg Per Tube BID  . pantoprazole  40 mg Oral Daily  . rosuvastatin  5 mg Oral q1800  . sodium chloride  10 mL Intravenous Q12H  . sodium chloride  3 mL Intravenous Q12H  . sodium chloride  3 mL Intravenous Q12H    . sodium chloride 20 mL/hr at 05/16/13 2000  . sodium chloride 20 mL/hr at 05/17/13 1806  . sodium chloride      Physical Exam:   General appearance: alert, cooperative and no distress Neck: no JVD, supple, symmetrical, trachea midline and thyroid not enlarged, symmetric, no tenderness/mass/nodules Lungs: decreased BS at bases Heart: regular rate and rhythm and 1/6 sem Abdomen: soft, non-tender; bowel sounds normal; no masses,  no organomegaly Extremities: edema 1+ LE Neurologic: Grossly normal   Rate: 70  Rhythm: normal  sinus rhythm  Lab Results:    Recent Labs  05/18/13 0440 05/19/13 0318  NA 139 139  K 4.7 4.6  CL 104 102  CO2 22 24  GLUCOSE 131* 109*  BUN 23 26*  CREATININE 1.06 1.03   No results found for this basename: TROPONINI, CK, MB,  in the last 72 hours Hepatic Function Panel No results found for this basename: PROT, ALBUMIN, AST, ALT, ALKPHOS, BILITOT, BILIDIR, IBILI,  in the last 72 hours No results found for this basename: INR,  in the last 72 hours BNP (last 3 results) No results found for this basename: PROBNP,  in the last 8760 hours  Lipid Panel     Component Value Date/Time   CHOL 119 05/12/2013 0043   TRIG 131 05/12/2013 0043   HDL 15* 05/12/2013 0043   CHOLHDL 7.9 05/12/2013 0043   VLDL 26 05/12/2013 0043   LDLCALC 78 05/12/2013 0043      Imaging:  Dg Chest 2 View  05/19/2013   CLINICAL DATA:  Diabetes, hypertension  EXAM: CHEST  2 VIEW  COMPARISON:  DG CHEST 1V PORT dated 05/18/2013; DG CHEST 1V PORT dated 05/16/2013  FINDINGS: Sternotomy wires  overlie normal cardiac silhouette. Interval removal of left chest tube. No pneumothorax. Small left effusion remains. Right IJ sheath remains.  IMPRESSION: 1. Removal of left chest tube without pneumothorax. 2. Small left effusion.   Electronically Signed   By: Suzy Bouchard M.D.   On: 05/19/2013 07:16      Assessment/Plan:   Principal Problem:   NSTEMI (non-ST elevated myocardial infarction) Active Problems:   Diabetes mellitus   Dyslipidemia   HTN (hypertension)   Rheumatoid arthritis   S/P CABG x 2   I/O + 100 yesterday; +8211 since admission.  Weight 209 today, lost 4 lbs in 2 days, but still increased from 203. Maintaining sinus rhythm without recurrent AF on increased BB and amiodarone. Continue diuresis for volume overload.   Troy Sine, MD, [facility] 05/20/2013, 10:42 AM

## 2013-05-20 NOTE — Progress Notes (Signed)
Physical Therapy Treatment Patient Details Name: Francis Wilkinson MRN: [id number] DOB: [birth year] Today's Date: 05/20/2013 Time: 0726-0745 PT Time Calculation (min): 19 min  PT Assessment / Plan / Recommendation  History of Present Illness 68 y/o from [place] with no prior history of CAD. He does have diabetes, HTN, dyslipidemia. Today after lunch he was carrying groceries in when he developed Lt sided chest pain associated with SOB, diaphoresis, and nausea. +NSTEMI and underwent CABG x2 3/2. PMHx - DM, RA (mostly affecting hands), HTN   PT Comments   Pt with improved functional mobility during session.  Anticipate pt will only need RW outside home and for long distances.  Follow Up Recommendations  No PT follow up;Supervision/Assistance - 24 hour     Does the patient have the potential to tolerate intense rehabilitation     Barriers to Discharge        Equipment Recommendations  Rolling walker with 5" wheels (pt. only has std W)    Recommendations for Other Services    Frequency Min 3X/week   Progress towards PT Goals Progress towards PT goals: Progressing toward goals  Plan Equipment recommendations need to be updated    Precautions / Restrictions Precautions Precautions: Sternal Precaution Comments: pt. did well maintaining precautions; min cues with bed mobility Restrictions Weight Bearing Restrictions: No   Pertinent Vitals/Pain No c/o pain; HR 95-97 with amb    Mobility  Bed Mobility Overal bed mobility: Needs Assistance Bed Mobility: Sit to Supine Sit to supine: Supervision (min cues for sternal precautions) General bed mobility comments: cues for reciprocal scooting to maintain precautions; pt. return demonstrated correct technique Transfers Overall transfer level: Modified independent Equipment used: Rolling walker (2 wheeled) Transfers: Sit to/from Stand Sit to Stand: Supervision;From elevated surface (simulated home bed  height) Ambulation/Gait Ambulation/Gait assistance: Supervision Ambulation Distance (Feet): 300 Feet Assistive device: Rolling walker (2 wheeled) Gait Pattern/deviations: WFL(Within Functional Limits) Gait velocity: decr "I walk slow enough so I'd have to speed up to fall" Gait velocity interpretation: Below normal speed for age/gender General Gait Details: overall steady gait, pt. ambulated 5 ft in room without RW with close supervision.  Anticipate pt. will be safe without RW in home for short distances, but recommend RW for prolonged amb and outside home    Exercises     PT Diagnosis:    PT Problem List:   PT Treatment Interventions:     PT Goals (current goals can now be found in the care plan section) Acute Rehab PT Goals Patient Stated Goal: be able to care for himself again PT Goal Formulation: With patient Time For Goal Achievement: 05/25/13 Potential to Achieve Goals: Good  Visit Information  Last PT Received On: 05/20/13 Assistance Needed: +1 History of Present Illness: 68 y/o from [place] with no prior history of CAD. He does have diabetes, HTN, dyslipidemia. Today after lunch he was carrying groceries in when he developed Lt sided chest pain associated with SOB, diaphoresis, and nausea. +NSTEMI and underwent CABG x2 3/2. PMHx - DM, RA (mostly affecting hands), HTN    Subjective Data  Subjective: "I think I can walk without it." Patient Stated Goal: be able to care for himself again   Cognition  Cognition Arousal/Alertness: Awake/alert Behavior During Therapy: WFL for tasks assessed/performed Overall Cognitive Status: Within Functional Limits for tasks assessed    Balance  Balance Overall balance assessment: No apparent balance deficits (not formally assessed);Modified Independent Sitting-balance support: Feet supported;No upper extremity supported Sitting balance-Leahy Scale: Good Standing  balance support: No upper extremity supported;During functional  activity Standing balance-Leahy Scale: Good General Comments General comments (skin integrity, edema, etc.): HR 96 with amb  End of Session PT - End of Session Equipment Utilized During Treatment: Gait belt Activity Tolerance: Patient tolerated treatment well Patient left: in chair;with call bell/phone within reach;with family/visitor present Nurse Communication: Mobility status   GP     [name] K 05/20/2013, 7:57 AM  Laureen Abrahams, PT, DPT [phone]

## 2013-05-20 NOTE — Progress Notes (Signed)
Pt ambulated 300 ft using RW on room air tolerated very well,O2 saturation post ambulation is 94%.

## 2013-05-20 NOTE — Progress Notes (Signed)
dc'ed pacing wires per unit protacall pt tolerated well   

## 2013-05-20 NOTE — Progress Notes (Signed)
       [address]       [place], [address]             [phone]          4 Days Post-Op Procedure(s) (LRB): CORONARY ARTERY BYPASS GRAFTING (CABG) (N/A) INTRAOPERATIVE TRANSESOPHAGEAL ECHOCARDIOGRAM (N/A)  Subjective: Feels well, no complaints.   Objective: Vital signs in last 24 hours: Patient Vitals for the past 24 hrs:  BP Temp Temp src Pulse Resp SpO2 Weight  05/20/13 0450 135/76 mmHg 98.1 F (36.7 C) Oral 79 18 96 % 209 lb 3.5 oz (94.9 kg)  05/19/13 2033 128/69 mmHg 98.2 F (36.8 C) Oral 81 18 94 % -  05/19/13 1350 123/78 mmHg 98 F (36.7 C) Oral 78 19 94 % -   Current Weight  05/20/13 209 lb 3.5 oz (94.9 kg)  PRE-OPERATIVE WEIGHT: 93 kg    Intake/Output from previous day: 03/05 0701 - 03/06 0700 In: 720 [P.O.:720] Out: -   CBGs [phone]     PHYSICAL EXAM:  Heart: RRR Lungs: Clear Wound: Clean and dry Extremities: Mild LE edema    Lab Results: CBC: Recent Labs  05/18/13 0440 05/19/13 0318  WBC 13.5* 12.3*  HGB 10.4* 11.3*  HCT 29.8* 33.5*  PLT 174 226   BMET:  Recent Labs  05/18/13 0440 05/19/13 0318  NA 139 139  K 4.7 4.6  CL 104 102  CO2 22 24  GLUCOSE 131* 109*  BUN 23 26*  CREATININE 1.06 1.03  CALCIUM 8.0* 8.5    PT/INR: No results found for this basename: LABPROT, INR,  in the last 72 hours    Assessment/Plan: S/P Procedure(s) (LRB): CORONARY ARTERY BYPASS GRAFTING (CABG) (N/A) INTRAOPERATIVE TRANSESOPHAGEAL ECHOCARDIOGRAM (N/A)  CV- Maintaining SR, BPs stable. Continue current meds.  DM- sugars stable, po meds restarted, will d/c insulin. (A1C=8.9)  Vol overload- diurese.  CRPI, pulm toilet.  Home in am if remains stable.   LOS: 9 days    Jemell Town H 05/20/2013

## 2013-05-20 NOTE — Discharge Summary (Signed)
[address]       [place] [address]             [phone]              Discharge Summary  Name: Francis Wilkinson DOB: 11-30-1945 68 y.o. MRN: [id number]   Admission Date: 05/11/2013 Discharge Date: 05/21/2013    Admitting Diagnosis: Chest pain   Discharge Diagnosis:  Non-ST elevation myocardial infarction Expected postoperative blood loss anemia Postoperative atrial fibrillation  Past Medical History  Diagnosis Date  . HTN (hypertension)   . Dyslipidemia   . 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 Diabetes mellitus, type II   . GERD (gastroesophageal reflux disease)   . Rheumatoid arthritis   . NSTEMI (non-ST elevated myocardial infarction) 04/2013      Procedures: CORONARY ARTERY BYPASS GRAFTING x 2 (Left internal mammary artery to left anterior descending, saphenous vein graft to posterior descending) ENDOSCOPIC VEIN HARVEST RIGHT LEG  LEFT UPPER LOBE LUNG BIOPSY - 05/16/2013    HPI:  The patient is a 68 y.o. male who presented to the ER at [facility] on the day of admission complaining of chest pain which began after carrying in groceries in the cold.  It was associated with shortness of breath, diaphoresis, left arm numbness and nausea. He described some intermittent chest pressure with exertion over the preceding 5 days, as well as some exertional dyspnea.  His symptoms on this day persisted despite rest, and he sought medical attention. Troponin was elevated at 10.  He was started on IV Heparin and transferred to [facility] for further evaluation.     Hospital Course:  The patient was admitted to [facility] on 05/11/2013. His pain resolved on Heparin.  Cardiac catheterization was performed on 05/12/2013 by Dr. Martinique, and revealed severe multivessel coronary artery disease with EF 45%, mild anterior hypokinesia with mild to moderate mitral regurgitation. A cardiac surgery consult was requested and Dr. Prescott Gum saw the  patient and reviewed his films.  It was felt that he would benefit from surgical revascularization. All risks, benefits and alternatives of surgery were explained in detail, and the patient agreed to proceed.   The patient was taken to the operating room and underwent the above procedure. Intraoperative TEE revealed only mild mitral regurgitation.  A lung biopsy was performed in light of his history of severe rheumatoid arthritis to rule out rheumatic lung disease.  The postoperative course was notable for atrial fibrillation which was treated with IV Amiodarone.  He did convert to sinus rhythm, and was later switched to po Amiodarone.  Of note, a lower than usual dose is being used due to the interaction with cyclosporine.  He was started on Lasix for volume overload and is diuresing well.  Home diabetes medications were resume once his po intake was adequate and blood sugars are generally stable. (Pre-op hemoglobin A1C=8.9)  Overall, he is doing well.  Incisions are healing appropriately. He is ambulating with cardiac rehab and progressing well with mobility.  He is maintaining sinus rhythm and blood pressures are stable. Lung biopsy revealed fibrotic changes, but no evidence of rheumatic lung disease.  He is presently medically stable for discharge home on today's date.     Recent vital signs:  Filed Vitals:   05/20/13 0450  BP: 135/76  Pulse: 79  Temp: 98.1 F (36.7 C)  Resp: 18    Recent laboratory studies:  CBC: Recent Labs  05/18/13 0440 05/19/13 0318  WBC 13.5* 12.3*  HGB 10.4* 11.3*  HCT 29.8* 33.5*  PLT 174 226   BMET:  Recent Labs  05/18/13 0440 05/19/13 0318  NA 139 139  K 4.7 4.6  CL 104 102  CO2 22 24  GLUCOSE 131* 109*  BUN 23 26*  CREATININE 1.06 1.03  CALCIUM 8.0* 8.5    PT/INR: No results found for this basename: LABPROT, INR,  in the last 72 hours   Discharge Medications:     Medication List    STOP taking these medications       methotrexate  2.5 MG tablet  Commonly known as:  RHEUMATREX     NIFEdipine 30 MG 24 hr tablet  Commonly known as:  PROCARDIA-XL/ADALAT-CC/NIFEDICAL-XL      TAKE these medications       amiodarone 200 MG tablet  Commonly known as:  PACERONE  Take 1 tablet (200 mg total) by mouth 2 (two) times daily.     aspirin 325 MG EC tablet  Take 1 tablet (325 mg total) by mouth daily.     cycloSPORINE 100 MG capsule  Commonly known as:  SANDIMMUNE  Take 1 capsule by mouth daily.     furosemide 40 MG tablet  Commonly known as:  LASIX  Take 1 tablet (40 mg total) by mouth daily. X 1 week     glimepiride 4 MG tablet  Commonly known as:  AMARYL  Take 1 tablet by mouth daily.     lisinopril 10 MG tablet  Commonly known as:  PRINIVIL,ZESTRIL  Take 1 tablet by mouth daily.     metoprolol tartrate 25 MG tablet  Commonly known as:  LOPRESSOR  Take 1 tablet (25 mg total) by mouth 2 (two) times daily.     oxyCODONE 5 MG immediate release tablet  Commonly known as:  Oxy IR/ROXICODONE  Take 1-2 tablets (5-10 mg total) by mouth every 3 (three) hours as needed for moderate pain.     rosuvastatin 5 MG tablet  Commonly known as:  CRESTOR  Take 1 tablet (5 mg total) by mouth daily.         Discharge Instructions:  The patient is to refrain from driving, heavy lifting or strenuous activity.  May shower daily and clean incisions with soap and water.  May resume regular diet.    Follow Up:   Follow-up Information   Follow up with VAN Wilber Oliphant, MD On 06/08/2013. (Have a chest x-ray at [place] at 8:30, then see MD at 9:30)    Specialty:  Cardiothoracic Surgery   Contact information:   [address] [place] [address] [phone]       Follow up with Dola Argyle, MD On 05/25/2013. (Appointment is at 2:30 )    Specialty:  Cardiology   Contact information:   [place] [address] [phone]        The patient has been discharged on:  1.Beta Blocker:  Yes [ x ]  No [ ]   If No, reason:    2.Ace Inhibitor/ARB: Yes [ x ]  No [  ]  If No, reason:    3.Statin: Yes [ X ]  No [  ]  If No, reason:    4.Ecasa: Yes [ x ]  No [ ]   If No, reason:   Quitman Norberto H 05/20/2013, 9:23 AM

## 2013-05-20 NOTE — Progress Notes (Signed)
CARDIAC REHAB PHASE I   PRE:  Rate/Rhythm: 94 afib  BP:  Supine:   Sitting: 110/70  Standing:    SaO2: 95%RA  MODE:  Ambulation: 640 ft   POST:  Rate/Rhythm: 123 afib  BP:  Supine:   Sitting: 110/80  Standing:    SaO2: 96%RA 1100-1200 Education completed with pt and wife who voiced understanding. Gave heart healthy and diabetic diets. Discussed CRP 2 and permission given to refer to [place]. Pt would like rolling walker for home use. Notified case Freight forwarder. Noted that pt in atrial fib prior to walk. Checked with RN and agreed in atrial fib. Walked 640 ft on RA with rolling walker and asst x 1. Denied palpitations until he sat down and then he could feel them . Has seen post op video.   Graylon Good, RN BSN  05/20/2013 11:54 AM

## 2013-05-21 LAB — GLUCOSE, CAPILLARY: GLUCOSE-CAPILLARY: 100 mg/dL — AB (ref 70–99)

## 2013-05-21 MED ORDER — METOPROLOL TARTRATE 25 MG PO TABS: 25.0000 mg | ORAL_TABLET | Freq: Two times a day (BID) | ORAL | Status: DC

## 2013-05-21 MED ORDER — ASPIRIN 325 MG PO TBEC: 325.0000 mg | DELAYED_RELEASE_TABLET | Freq: Every day | ORAL | Status: DC

## 2013-05-21 MED ORDER — FUROSEMIDE 40 MG PO TABS: 40.0000 mg | ORAL_TABLET | Freq: Every day | ORAL | Status: DC

## 2013-05-21 MED ORDER — OXYCODONE HCL 5 MG PO TABS: 5.0000 mg | ORAL_TABLET | ORAL | Status: DC | PRN

## 2013-05-21 MED ORDER — ROSUVASTATIN CALCIUM 5 MG PO TABS: 5.0000 mg | ORAL_TABLET | Freq: Every day | ORAL | Status: DC

## 2013-05-21 MED ORDER — AMIODARONE HCL 200 MG PO TABS: 200.0000 mg | ORAL_TABLET | Freq: Two times a day (BID) | ORAL | Status: DC

## 2013-05-21 NOTE — Progress Notes (Signed)
Pt discharging home with family.  All instructions and prescriptions given and reviewed, all questions answered.  Follow up appts in place.  RW delivered to room prior to DC.

## 2013-05-21 NOTE — Progress Notes (Signed)
   CARE MANAGEMENT NOTE 05/21/2013  Patient:  Francis Wilkinson, Francis Wilkinson   Account Number:  [id number]  Date Initiated:  05/16/2013  Documentation initiated by:  [facility]  Subjective/Objective Assessment:   Admitted with NSTEMI - 3-2 post op CABG x2.     Action/Plan:   Anticipated DC Date:  05/20/2013   Anticipated DC Plan:  [place]  CM consult      Choice offered to / List presented to:             Status of service:  Completed, signed off Medicare Important Message given?   (If response is "NO", the following Medicare IM given date fields will be blank) Date Medicare IM given:   Date Additional Medicare IM given:    Discharge Disposition:  HOME/SELF CARE  Per UR Regulation:  Reviewed for med. necessity/level of care/duration of stay  If discussed at [place] of Stay Meetings, dates discussed:    Comments:  05/21/13 10:45 CM received call for RW.  Cm called DME delivery and RW will be delivered to room prior to discharge.  No PT follow up recc.  No other CM needs were communicated.  Mariane Masters, BSN, CM [phone].  Contact:  Francis Wilkinson,Francis Wilkinson Spouse [phone]   [phone]                 Francis Wilkinson,Francis Wilkinson Daughter     [phone]  05-17-13 11:15am [name], RNBSN. Patient awake, alert in bed.  Wife at bedside.  Wife states patient lives with her and daughter.  Independent prior to surgery.  Plan to get him home with 24/7 coverage.   65 - daughter with recent foot surgery - back at work now - will be home evenings and at night.  Wife home now but may be having abd surgery next week.  however,  if this happens, the wife and pateint have assured me they have other friends/family arranged to have someone with him 24/7 .  CM will continue to follow for further needs.

## 2013-05-21 NOTE — Progress Notes (Signed)
       [address]       [place] [address]             [phone]          5 Days Post-Op Procedure(s) (LRB): CORONARY ARTERY BYPASS GRAFTING (CABG) (N/A) INTRAOPERATIVE TRANSESOPHAGEAL ECHOCARDIOGRAM (N/A)  Subjective: Feels well, wants to go home.   Objective: Vital signs in last 24 hours: Patient Vitals for the past 24 hrs:  BP Temp Temp src Pulse Resp SpO2 Weight  05/21/13 0354 120/80 mmHg 97.5 F (36.4 C) Oral 72 18 95 % 208 lb 12.4 oz (94.7 kg)  05/20/13 1630 112/64 mmHg - - 78 20 97 % -  05/20/13 1530 107/64 mmHg - - 67 - - -  05/20/13 1430 132/70 mmHg - - 90 - - -  05/20/13 1415 100/58 mmHg - - 89 - - -  05/20/13 1400 112/65 mmHg - - 80 20 98 % -   Current Weight  05/21/13 208 lb 12.4 oz (94.7 kg)     Intake/Output from previous day: 03/06 0701 - 03/07 0700 In: 960 [P.O.:960] Out: -     PHYSICAL EXAM:  Heart: RRR Lungs: Clear Wound: Clean and dry Extremities: Mild LE edema   Lab Results: CBC: Recent Labs  05/19/13 0318  WBC 12.3*  HGB 11.3*  HCT 33.5*  PLT 226   BMET:  Recent Labs  05/19/13 0318  NA 139  K 4.6  CL 102  CO2 24  GLUCOSE 109*  BUN 26*  CREATININE 1.03  CALCIUM 8.5    PT/INR: No results found for this basename: LABPROT, INR,  in the last 72 hours    Assessment/Plan: S/P Procedure(s) (LRB): CORONARY ARTERY BYPASS GRAFTING (CABG) (N/A) INTRAOPERATIVE TRANSESOPHAGEAL ECHOCARDIOGRAM (N/A) CV- Maintaining SR, BPs stable. Continue current meds.  DM- sugars stable on po meds.(A1C=8.9)  Vol overload- diurese.  D/c home this am- instructions reviewed with patient.   LOS: 10 days    Cathy Ropp H 05/21/2013

## 2013-05-25 ENCOUNTER — Encounter: Payer: Self-pay | Admitting: Cardiology

## 2013-05-25 ENCOUNTER — Ambulatory Visit (INDEPENDENT_AMBULATORY_CARE_PROVIDER_SITE_OTHER): Payer: Medicare Other | Admitting: Cardiology

## 2013-05-25 VITALS — BP 118/78 | HR 78 | Ht 68.0 in | Wt 209.0 lb

## 2013-05-25 DIAGNOSIS — Z951 Presence of aortocoronary bypass graft: Secondary | ICD-10-CM | POA: Insufficient documentation

## 2013-05-25 DIAGNOSIS — I4891 Unspecified atrial fibrillation: Secondary | ICD-10-CM | POA: Insufficient documentation

## 2013-05-25 DIAGNOSIS — I340 Nonrheumatic mitral (valve) insufficiency: Secondary | ICD-10-CM

## 2013-05-25 DIAGNOSIS — I2510 Atherosclerotic heart disease of native coronary artery without angina pectoris: Secondary | ICD-10-CM

## 2013-05-25 DIAGNOSIS — R0989 Other specified symptoms and signs involving the circulatory and respiratory systems: Secondary | ICD-10-CM

## 2013-05-25 DIAGNOSIS — I059 Rheumatic mitral valve disease, unspecified: Secondary | ICD-10-CM

## 2013-05-25 DIAGNOSIS — I10 Essential (primary) hypertension: Secondary | ICD-10-CM

## 2013-05-25 DIAGNOSIS — R9430 Abnormal result of cardiovascular function study, unspecified: Secondary | ICD-10-CM

## 2013-05-25 DIAGNOSIS — R9389 Abnormal findings on diagnostic imaging of other specified body structures: Secondary | ICD-10-CM

## 2013-05-25 DIAGNOSIS — IMO0002 Reserved for concepts with insufficient information to code with codable children: Secondary | ICD-10-CM

## 2013-05-25 DIAGNOSIS — E785 Hyperlipidemia, unspecified: Secondary | ICD-10-CM

## 2013-05-25 DIAGNOSIS — R918 Other nonspecific abnormal finding of lung field: Secondary | ICD-10-CM

## 2013-05-25 MED ORDER — AMIODARONE HCL 200 MG PO TABS: 200.0000 mg | ORAL_TABLET | Freq: Every day | ORAL | Status: DC

## 2013-05-25 NOTE — Assessment & Plan Note (Signed)
Blood pressure is well controlled. No change in therapy.

## 2013-05-25 NOTE — Patient Instructions (Signed)
Your physician recommends that you schedule a follow-up appointment in: 4 weeks. Your physician has recommended you make the following change in your medication:  Decrease your amiodarone 200 mg to daily.  All other medications will remain the same.

## 2013-05-25 NOTE — Assessment & Plan Note (Addendum)
The patient had postoperative atrial fibrillation. He came home on 200 mg of amiodarone twice a day. He is in sinus rhythm. I have lowered the dose to 200 mg daily. I will see him back for early followup to be sure that his rhythm is stable.  Today I spent greater than one hour with the patient's overall care. For this half of this time was spent with direct contact with him reviewing all of the history . I also made telephone calls to obtain further information concerning choice of medications.

## 2013-05-25 NOTE — Assessment & Plan Note (Signed)
The patient did have a lung biopsy to be sure that he does not have rheumatoid lung. The report showed only nonspecific changes. There was no evidence of chronic interstitial lung disease. No further workup at this time.

## 2013-05-25 NOTE — Progress Notes (Signed)
HPI  The patient is new to me today. He was recently transported from [place] and to [place]. He eventually had bypass surgery. He had atrial fibrillation postoperatively. He has severe rheumatoid arthritis and has been on cyclosporin and methotrexate. Careful attention has been paid to being sure that he is on a low dose of a statin. The patient also had abnormal chest x-rays. He had a lung biopsy to be sure that he did not have evidence of rheumatoid lung. The pathology report says that there is no definite diagnosis of interstitial lung disease.  As part of today's evaluation I reviewed the H&P and the operative note and the discharge summary and the catheterization report. I also had to review the individual progress notes to try to understand better whether the patient could be on a statin or not. In addition I called our clinical pharmacologist to discuss the medications with her. After complete review we decided together that Crestor is safe for the patient at 5 mg daily in conjunction with his cyclosporine and methotrexate.  The patient is actually doing very well after his bypass surgery. He was discharged on May 20, 2013. Early post hospital followup was arranged with me. The appointment is with me because the patient is from [facility] [place] and is served best by coming to our ED and office.     No Known Allergies  Current Outpatient Prescriptions  Medication Sig Dispense Refill  . acetaminophen (TYLENOL) 325 MG tablet Take 325-650 mg by mouth every 6 (six) hours as needed.      [name] amiodarone (PACERONE) 200 MG tablet Take 1 tablet (200 mg total) by mouth daily.  30 tablet  1  . aspirin EC 325 MG EC tablet Take 1 tablet (325 mg total) by mouth daily.  30 tablet  0  . CALCIUM-VITAMIN D PO Take 1 tablet by mouth daily.      . cycloSPORINE (SANDIMMUNE) 100 MG capsule Take 1 capsule by mouth daily.      [name] esomeprazole (NEXIUM) 40 MG capsule Take 40 mg by mouth as needed.      . folic  acid (FOLVITE) 1 MG tablet Take 1 mg by mouth daily.      . furosemide (LASIX) 40 MG tablet Take 1 tablet (40 mg total) by mouth daily. X 1 week  7 tablet  0  . glimepiride (AMARYL) 4 MG tablet Take 1 tablet by mouth daily.      [name] lisinopril (PRINIVIL,ZESTRIL) 10 MG tablet Take 1 tablet by mouth daily.      . metoprolol tartrate (LOPRESSOR) 25 MG tablet Take 1 tablet (25 mg total) by mouth 2 (two) times daily.  60 tablet  1  . NIFEdipine (PROCARDIA-XL/ADALAT-CC/NIFEDICAL-XL) 30 MG 24 hr tablet Take 30 mg by mouth daily.      . rosuvastatin (CRESTOR) 5 MG tablet Take 1 tablet (5 mg total) by mouth daily.  30 tablet  1   No current facility-administered medications for this visit.    History   Social History  . Marital Status: Married    Spouse Name: N/A    Number of Children: N/A  . Years of Education: N/A   Occupational History  . Not on file.   Social History Main Topics  . Smoking status: Former Smoker -- 2.00 packs/day for 10 years    Types: Cigarettes    Quit date: 09/15/1971  . Smokeless tobacco: Never Used  . Alcohol Use: No  . Drug Use: No  .  Sexual Activity: Yes   Other Topics Concern  . Not on file   Social History Narrative  . No narrative on file    Family History  Problem Relation Age of Onset  . Coronary artery disease Brother     Past Medical History  Diagnosis Date  . HTN (hypertension)   . Dyslipidemia   . 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 Diabetes mellitus, type II   . GERD (gastroesophageal reflux disease)   . Rheumatoid arthritis   . CAD (coronary artery disease)   . Hx of CABG     February, 2015  . Mitral regurgitation   . Ejection fraction   . Atrial fibrillation     Past Surgical History  Procedure Laterality Date  . Inguinal hernia repair Left ~ 1970  . Coronary artery bypass graft N/A 05/16/2013    Procedure: CORONARY ARTERY BYPASS GRAFTING (CABG);  Surgeon: [name], MD;  Location: [place];  Service: Open Heart  Surgery;  Laterality: N/A;  Times 2 using left internal mammary artery and endoscopically harvested right saphenous vein.  . Intraoperative transesophageal echocardiogram N/A 05/16/2013    Procedure: INTRAOPERATIVE TRANSESOPHAGEAL ECHOCARDIOGRAM;  Surgeon: [name], MD;  Location: [place];  Service: Open Heart Surgery;  Laterality: N/A;    Patient Active Problem List   Diagnosis Date Noted  . CAD (coronary artery disease)   . Hx of CABG   . Mitral regurgitation   . Ejection fraction   . Atrial fibrillation   . Diabetes mellitus 05/11/2013  . Dyslipidemia 05/11/2013  . HTN (hypertension) 05/11/2013  . Rheumatoid arthritis 05/11/2013    ROS   Patient denies fever, chills, headache, sweats, rash, change in vision, change in hearing, chest pain, cough, nausea vomiting, urinary symptoms. All other systems are reviewed and are negative.  PHYSICAL EXAM   Patient is here with his wife. He is oriented to person time and place. Affect is normal. There is no jugulovenous distention. Lungs reveal scattered rhonchi with decreased breath sounds. Cardiac exam reveals an S1 and S2. The rhythm is regular. His chest is nicely healed. The abdomen is soft. There is no peripheral edema. His vein harvest sites are healing. There no significant rashes. He has deforming rheumatoid arthritis with classic changes in his hands.  Filed Vitals:   05/25/13 1447  BP: 118/78  Pulse: 78  Height: 5\' 8"  (1.727 m)  Weight: 209 lb (94.802 kg)   EKG was done today and reviewed by me. There are scattered PVCs. There is sinus rhythm.  ASSESSMENT & PLAN

## 2013-05-25 NOTE — Assessment & Plan Note (Addendum)
The patient's ejection fraction was 45% with mild anterior hypokinesis at the time of his catheterization for non-STEMI February, 2015. He is on low-dose lisinopril. He is on amiodarone and his rate is borderline slow. He is on a low dose of metoprolol also at this time. Followup echo will be indicated.

## 2013-05-25 NOTE — Assessment & Plan Note (Signed)
After a great deal of review I am confident that the patient can receive low dose Crestor. It should be continued at low dose because of additional medications including cyclosporin and methotrexate.

## 2013-05-25 NOTE — Assessment & Plan Note (Signed)
Patient had a recent non-STEMI followed by bypass surgery. This was done March, 2015.

## 2013-05-25 NOTE — Assessment & Plan Note (Signed)
Patient had mild to moderate mitral regurgitation this time of cath February, 2015. Intraoperative TEE suggested mild mitral regurgitation. The patient will need a followup echo at a later date to reassess LV function and mitral regurgitation.

## 2013-05-31 ENCOUNTER — Other Ambulatory Visit: Payer: Self-pay | Admitting: *Deleted

## 2013-05-31 DIAGNOSIS — I2510 Atherosclerotic heart disease of native coronary artery without angina pectoris: Secondary | ICD-10-CM

## 2013-06-02 NOTE — Discharge Summary (Signed)
patient examined and medical record reviewed,agree with above note. [name],Francis Wilkinson 06/02/2013

## 2013-06-08 ENCOUNTER — Ambulatory Visit (INDEPENDENT_AMBULATORY_CARE_PROVIDER_SITE_OTHER): Payer: Self-pay | Admitting: Cardiothoracic Surgery

## 2013-06-08 ENCOUNTER — Encounter: Payer: Self-pay | Admitting: Cardiothoracic Surgery

## 2013-06-08 ENCOUNTER — Ambulatory Visit
Admission: RE | Admit: 2013-06-08 | Discharge: 2013-06-08 | Disposition: A | Discharge status: Home / Self Care | Payer: Medicare Other | Source: Ambulatory Visit | Attending: Cardiothoracic Surgery | Admitting: Cardiothoracic Surgery

## 2013-06-08 VITALS — BP 106/71 | HR 79 | Resp 16 | Ht 68.0 in | Wt 198.0 lb

## 2013-06-08 DIAGNOSIS — Z9889 Other specified postprocedural states: Secondary | ICD-10-CM

## 2013-06-08 DIAGNOSIS — I2510 Atherosclerotic heart disease of native coronary artery without angina pectoris: Secondary | ICD-10-CM

## 2013-06-08 DIAGNOSIS — J438 Other emphysema: Secondary | ICD-10-CM

## 2013-06-08 DIAGNOSIS — Z951 Presence of aortocoronary bypass graft: Secondary | ICD-10-CM

## 2013-06-08 DIAGNOSIS — I214 Non-ST elevation (NSTEMI) myocardial infarction: Secondary | ICD-10-CM

## 2013-06-08 DIAGNOSIS — J439 Emphysema, unspecified: Secondary | ICD-10-CM

## 2013-06-08 MED ORDER — AMIODARONE HCL 200 MG PO TABS: 200.0000 mg | ORAL_TABLET | Freq: Every day | ORAL | Status: DC

## 2013-06-08 NOTE — Progress Notes (Signed)
PCP is [name], MD Referring Provider is Martinique, Peter M, MD  Chief Complaint  Patient presents with  . Routine Post Op    3 wk f/u s/p CABG and BX of LULobe with a cxr...has been seen by Dr. Ron Parker    HPI: Patient presents for one month followup after multivessel CABG for unstable angina. The patient has severe rheumatoid arthritis on cyclosporin and methotrexate. He developed postoperative atrial arrhythmia and has been on amiodarone 200 twice a day The patient is noted improved exercise tolerance and surgery, no recurrent angina, and the surgical incisions are well-healed. The patient has not resumed his methotrexate since surgery. A left lung biopsy performed at CABG demonstrated no evidence of rheumatoid lung disease Past Medical History  Diagnosis Date  . HTN (hypertension)   . Dyslipidemia   . 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 Diabetes mellitus, type II   . GERD (gastroesophageal reflux disease)   . Rheumatoid arthritis   . CAD (coronary artery disease)   . Hx of CABG     February, 2015  . Mitral regurgitation   . Ejection fraction   . Atrial fibrillation     Past Surgical History  Procedure Laterality Date  . Inguinal hernia repair Left ~ 1970  . Coronary artery bypass graft N/A 05/16/2013    Procedure: CORONARY ARTERY BYPASS GRAFTING (CABG);  Surgeon: [name], MD;  Location: [place];  Service: Open Heart Surgery;  Laterality: N/A;  Times 2 using left internal mammary artery and endoscopically harvested right saphenous vein.  . Intraoperative transesophageal echocardiogram N/A 05/16/2013    Procedure: INTRAOPERATIVE TRANSESOPHAGEAL ECHOCARDIOGRAM;  Surgeon: [name], MD;  Location: [place];  Service: Open Heart Surgery;  Laterality: N/A;    Family History  Problem Relation Age of Onset  . Coronary artery disease Brother     Social History History  Substance Use Topics  . Smoking status: Former Smoker -- 2.00 packs/day for 10 years     Types: Cigarettes    Quit date: 09/15/1971  . Smokeless tobacco: Never Used  . Alcohol Use: No    Current Outpatient Prescriptions  Medication Sig Dispense Refill  . acetaminophen (TYLENOL) 325 MG tablet Take 325-650 mg by mouth every 6 (six) hours as needed.      [name] amiodarone (PACERONE) 200 MG tablet Take 1 tablet (200 mg total) by mouth daily.  30 tablet  1  . aspirin EC 325 MG EC tablet Take 1 tablet (325 mg total) by mouth daily.  30 tablet  0  . CALCIUM-VITAMIN D PO Take 1 tablet by mouth daily.      . cycloSPORINE (SANDIMMUNE) 100 MG capsule Take 1 capsule by mouth daily.      [name] esomeprazole (NEXIUM) 40 MG capsule Take 40 mg by mouth as needed.      . folic acid (FOLVITE) 1 MG tablet Take 1 mg by mouth daily.      [name] glimepiride (AMARYL) 4 MG tablet Take 1 tablet by mouth daily.      [name] lisinopril (PRINIVIL,ZESTRIL) 10 MG tablet Take 1 tablet by mouth daily.      . metoprolol tartrate (LOPRESSOR) 25 MG tablet Take 1 tablet (25 mg total) by mouth 2 (two) times daily.  60 tablet  1  . NIFEdipine (PROCARDIA-XL/ADALAT-CC/NIFEDICAL-XL) 30 MG 24 hr tablet Take 30 mg by mouth daily.      . rosuvastatin (CRESTOR) 5 MG tablet Take 1 tablet (5  mg total) by mouth daily.  30 tablet  1   No current facility-administered medications for this visit.    No Known Allergies  Review of Systems improved sleep, improved appetite, mild ankle swelling, arthritis symptoms tolerable-not taking narcotics  BP 106/71  Pulse 79  Resp 16  Ht 5\' 8"  (1.727 m)  Wt 198 lb (89.812 kg)  BMI 30.11 kg/m2  SpO2 97% Physical Exam Alert and comfortable Lungs clear Surgical incision is well-healed Heart rhythm with some ectopy Mild ankle edema  Diagnostic Tests: Chest x-ray clear no pleural effusions Rhythm strip done in the office today shows sinus rhythm with PACs and a few PVCs  Impression: Excellent recover after multivessel CABG Fortunately the surgical incisions are healing well and the patient  may resume his methotrexate  Continue amiodarone until checked by cardiology-Dr. Ron Parker Ready to start outpatient rehabilitation Return for surgical followup as needed

## 2013-06-14 ENCOUNTER — Encounter: Payer: Self-pay | Admitting: *Deleted

## 2013-06-14 NOTE — Progress Notes (Signed)
Patient ID: Francis Wilkinson, male   DOB: [birth year], 68 y.o.   MRN: [id number] A referral was faxed to [facility] requesting he be notified to begin Cardiac Rehab Phase II per Dr. Lucianne Lei Trigt's request.

## 2013-06-14 NOTE — Progress Notes (Signed)
Patient ID: Francis Wilkinson, male   DOB: 1945-08-10, 68 y.o.   MRN: [id number]

## 2013-07-05 ENCOUNTER — Encounter (HOSPITAL_COMMUNITY): Payer: Medicare Other

## 2013-07-07 ENCOUNTER — Encounter (HOSPITAL_COMMUNITY)
Admission: RE | Admit: 2013-07-07 | Discharge: 2013-07-07 | Disposition: A | Discharge status: Home / Self Care | Payer: Medicare Other | Source: Ambulatory Visit | Attending: Cardiology | Admitting: Cardiology

## 2013-07-07 ENCOUNTER — Encounter (HOSPITAL_COMMUNITY): Payer: Self-pay

## 2013-07-07 VITALS — BP 90/62 | HR 70 | Ht 68.0 in | Wt 197.9 lb

## 2013-07-07 DIAGNOSIS — I25810 Atherosclerosis of coronary artery bypass graft(s) without angina pectoris: Secondary | ICD-10-CM

## 2013-07-07 DIAGNOSIS — I214 Non-ST elevation (NSTEMI) myocardial infarction: Secondary | ICD-10-CM | POA: Insufficient documentation

## 2013-07-07 NOTE — Progress Notes (Addendum)
Patient referred to Cardiac Rehab by Dr. Lawson Fiscal post CABGx3 414.04 and NSTEMI 410.70. During orientation advised patient on arrival and appointment times what to wear, what to do before, during and after exercise. Reviewed attendance and class policy. Talked about inclement weather and class consultation policy. Pt is scheduled to start Cardiac Rehab on 07/11/13 at 11:00am. Pt was advised to come to class 5 minutes before class starts. He was also given instructions on meeting with the dietician and attending the Family Structure classes. Pt is eager to get started. Patient was able to complete 6 minute walk test.

## 2013-07-07 NOTE — Patient Instructions (Signed)
Pt has finished orientation and is scheduled to start CR on 07/11/13 at 11 am. Pt has been instructed to arrive to class 15 minutes early for scheduled class. Pt has been instructed to wear comfortable clothing and shoes with rubber soles. Pt has been told to take their medications 1 hour prior to coming to class.  If the patient is not going to attend class, he/she has been instructed to call.

## 2013-07-11 ENCOUNTER — Encounter (HOSPITAL_COMMUNITY)
Admission: RE | Admit: 2013-07-11 | Discharge: 2013-07-11 | Disposition: A | Discharge status: Home / Self Care | Payer: Medicare Other | Source: Ambulatory Visit | Attending: Cardiology | Admitting: Cardiology

## 2013-07-13 ENCOUNTER — Encounter: Payer: Self-pay | Admitting: Cardiology

## 2013-07-13 ENCOUNTER — Encounter (HOSPITAL_COMMUNITY): Payer: Medicare Other

## 2013-07-13 ENCOUNTER — Ambulatory Visit (INDEPENDENT_AMBULATORY_CARE_PROVIDER_SITE_OTHER): Payer: Medicare Other | Admitting: Cardiology

## 2013-07-13 VITALS — BP 115/73 | HR 74 | Ht 68.0 in | Wt 196.0 lb

## 2013-07-13 DIAGNOSIS — Z9229 Personal history of other drug therapy: Secondary | ICD-10-CM | POA: Insufficient documentation

## 2013-07-13 DIAGNOSIS — I4891 Unspecified atrial fibrillation: Secondary | ICD-10-CM

## 2013-07-13 DIAGNOSIS — I255 Ischemic cardiomyopathy: Secondary | ICD-10-CM | POA: Insufficient documentation

## 2013-07-13 DIAGNOSIS — I059 Rheumatic mitral valve disease, unspecified: Secondary | ICD-10-CM

## 2013-07-13 DIAGNOSIS — I2510 Atherosclerotic heart disease of native coronary artery without angina pectoris: Secondary | ICD-10-CM

## 2013-07-13 DIAGNOSIS — E785 Hyperlipidemia, unspecified: Secondary | ICD-10-CM

## 2013-07-13 DIAGNOSIS — I2589 Other forms of chronic ischemic heart disease: Secondary | ICD-10-CM

## 2013-07-13 DIAGNOSIS — I10 Essential (primary) hypertension: Secondary | ICD-10-CM

## 2013-07-13 DIAGNOSIS — I340 Nonrheumatic mitral (valve) insufficiency: Secondary | ICD-10-CM

## 2013-07-13 NOTE — Assessment & Plan Note (Signed)
The patient's EF was 45% around the time of his non-STEMI and bypass surgery in February, 2015. A later date we'll do a followup echo.

## 2013-07-13 NOTE — Assessment & Plan Note (Signed)
Blood pressures control. No change in therapy. 

## 2013-07-13 NOTE — Assessment & Plan Note (Signed)
Mitral regurgitation will be followed at a later date with echo.

## 2013-07-13 NOTE — Assessment & Plan Note (Signed)
EKG is not done today. His rhythm is quite regular on exam. There is no evidence of return of atrial fibrillation. Amiodarone will be stopped at this time.

## 2013-07-13 NOTE — Assessment & Plan Note (Signed)
His lipids are being treated with the appropriate dose of Crestor to be used with cyclosporin and methotrexate.

## 2013-07-13 NOTE — Assessment & Plan Note (Signed)
Coronary disease is stable post CABG. No further workup.

## 2013-07-13 NOTE — Patient Instructions (Signed)
Your physician recommends that you schedule a follow-up appointment in: 3-4 months. Your physician has recommended you make the following change in your medication:  Stop amiodarone. Continue all other medications.

## 2013-07-13 NOTE — Progress Notes (Signed)
Patient ID: Francis Wilkinson, male   DOB: 02/21/1946, 68 y.o.   MRN: [id number]    HPI  Patient is seen today to followup coronary disease and postoperative atrial fibrillation. I saw him last May 25, 2013. He was seen by Dr.Van Kerby Less for cardiothoracic surgery followup June 08, 2013. He is doing very well. He has deforming rheumatoid arthritis. He is on multiple medicines for this. His dose of 5 mg of Crestor was specifically picked as the appropriate dose to go along with his other medications. He is doing well. He is not having any palpitations.  No Known Allergies  Current Outpatient Prescriptions  Medication Sig Dispense Refill  . acetaminophen (TYLENOL) 325 MG tablet Take 325-650 mg by mouth every 6 (six) hours as needed.      [name] amiodarone (PACERONE) 200 MG tablet Take 1 tablet (200 mg total) by mouth daily.  30 tablet  1  . aspirin EC 325 MG EC tablet Take 1 tablet (325 mg total) by mouth daily.  30 tablet  0  . CALCIUM-VITAMIN D PO Take 1 tablet by mouth daily.      . cycloSPORINE (SANDIMMUNE) 100 MG capsule Take 1 capsule by mouth daily.      . folic acid (FOLVITE) 1 MG tablet Take 1 mg by mouth daily.      [name] glimepiride (AMARYL) 4 MG tablet Take 1 tablet by mouth daily.      [name] lisinopril (PRINIVIL,ZESTRIL) 10 MG tablet Take 1 tablet by mouth daily.      . methotrexate 2.5 MG tablet Take 2.5 mg by mouth daily.      . metoprolol tartrate (LOPRESSOR) 25 MG tablet Take 1 tablet (25 mg total) by mouth 2 (two) times daily.  60 tablet  1  . NIFEdipine (PROCARDIA-XL/ADALAT-CC/NIFEDICAL-XL) 30 MG 24 hr tablet Take 30 mg by mouth daily.      . rosuvastatin (CRESTOR) 5 MG tablet Take 1 tablet (5 mg total) by mouth daily.  30 tablet  1   No current facility-administered medications for this visit.    History   Social History  . Marital Status: Married    Spouse Name: N/A    Number of Children: N/A  . Years of Education: N/A   Occupational History  . Not on file.   Social History  Main Topics  . Smoking status: Former Smoker -- 2.00 packs/day for 10 years    Types: Cigarettes    Quit date: 09/15/1971  . Smokeless tobacco: Never Used  . Alcohol Use: No  . Drug Use: No  . Sexual Activity: Yes   Other Topics Concern  . Not on file   Social History Narrative  . No narrative on file    Family History  Problem Relation Age of Onset  . Coronary artery disease Brother     Past Medical History  Diagnosis Date  . HTN (hypertension)   . Dyslipidemia   . 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 Diabetes mellitus, type II   . GERD (gastroesophageal reflux disease)   . Rheumatoid arthritis   . CAD (coronary artery disease)   . Hx of CABG     February, 2015  . Mitral regurgitation   . Ejection fraction   . Atrial fibrillation     Past Surgical History  Procedure Laterality Date  . Inguinal hernia repair Left ~ 1970  . Coronary artery bypass graft N/A 05/16/2013    Procedure: CORONARY ARTERY  BYPASS GRAFTING (CABG);  Surgeon: [name], MD;  Location: [place];  Service: Open Heart Surgery;  Laterality: N/A;  Times 2 using left internal mammary artery and endoscopically harvested right saphenous vein.  . Intraoperative transesophageal echocardiogram N/A 05/16/2013    Procedure: INTRAOPERATIVE TRANSESOPHAGEAL ECHOCARDIOGRAM;  Surgeon: [name], MD;  Location: [place];  Service: Open Heart Surgery;  Laterality: N/A;    Patient Active Problem List   Diagnosis Date Noted  . H/O amiodarone therapy 07/13/2013  . Abnormal chest x-ray 05/25/2013  . CAD (coronary artery disease)   . Hx of CABG   . Mitral regurgitation   . Ejection fraction   . Atrial fibrillation   . Diabetes mellitus 05/11/2013  . Dyslipidemia 05/11/2013  . HTN (hypertension) 05/11/2013  . Rheumatoid arthritis 05/11/2013    ROS   Patient denies fever, chills, headache, sweats, rash, change in vision, change in hearing, chest pain, cough, nausea vomiting, urinary symptoms. All  other systems are reviewed and are negative.  PHYSICAL EXAM  Patient is oriented to person time and place. Affect is normal. Head is atraumatic. Sclera and conjunctiva are normal. There is no jugulovenous distention. Lungs are clear. Respiratory effort is nonlabored. Cardiac exam reveals S1 and S2. The rhythm is regular. The abdomen is soft. There is no peripheral edema. He has deforming rheumatoid arthritis with classic changes in his hands. He has no peripheral edema.  Filed Vitals:   07/13/13 1102  BP: 115/73  Pulse: 74  Height: 5\' 8"  (1.727 m)  Weight: 196 lb (88.905 kg)  SpO2: 97%     ASSESSMENT & PLAN

## 2013-07-15 ENCOUNTER — Encounter (HOSPITAL_COMMUNITY)
Admission: RE | Admit: 2013-07-15 | Discharge: 2013-07-15 | Disposition: A | Discharge status: Home / Self Care | Payer: Medicare Other | Source: Ambulatory Visit | Attending: Cardiology | Admitting: Cardiology

## 2013-07-15 DIAGNOSIS — I214 Non-ST elevation (NSTEMI) myocardial infarction: Secondary | ICD-10-CM | POA: Insufficient documentation

## 2013-07-18 ENCOUNTER — Encounter (HOSPITAL_COMMUNITY)
Admission: RE | Admit: 2013-07-18 | Discharge: 2013-07-18 | Disposition: A | Discharge status: Home / Self Care | Payer: Medicare Other | Source: Ambulatory Visit | Attending: Cardiology | Admitting: Cardiology

## 2013-07-20 ENCOUNTER — Encounter (HOSPITAL_COMMUNITY)
Admission: RE | Admit: 2013-07-20 | Discharge: 2013-07-20 | Disposition: A | Discharge status: Home / Self Care | Payer: Medicare Other | Source: Ambulatory Visit | Attending: Cardiology | Admitting: Cardiology

## 2013-07-20 ENCOUNTER — Other Ambulatory Visit: Payer: Self-pay | Admitting: Physician Assistant

## 2013-07-22 ENCOUNTER — Encounter (HOSPITAL_COMMUNITY)
Admission: RE | Admit: 2013-07-22 | Discharge: 2013-07-22 | Disposition: A | Discharge status: Home / Self Care | Payer: Medicare Other | Source: Ambulatory Visit | Attending: Cardiology | Admitting: Cardiology

## 2013-07-25 ENCOUNTER — Encounter (HOSPITAL_COMMUNITY)
Admission: RE | Admit: 2013-07-25 | Discharge: 2013-07-25 | Disposition: A | Discharge status: Home / Self Care | Payer: Medicare Other | Source: Ambulatory Visit | Attending: Cardiology | Admitting: Cardiology

## 2013-07-27 ENCOUNTER — Other Ambulatory Visit: Payer: Self-pay | Admitting: Physician Assistant

## 2013-07-27 ENCOUNTER — Encounter (HOSPITAL_COMMUNITY)
Admission: RE | Admit: 2013-07-27 | Discharge: 2013-07-27 | Disposition: A | Discharge status: Home / Self Care | Payer: Medicare Other | Source: Ambulatory Visit | Attending: Cardiology | Admitting: Cardiology

## 2013-07-27 ENCOUNTER — Other Ambulatory Visit: Payer: Self-pay | Admitting: *Deleted

## 2013-07-27 MED ORDER — METOPROLOL TARTRATE 25 MG PO TABS: 25.0000 mg | ORAL_TABLET | Freq: Two times a day (BID) | ORAL | Status: DC

## 2013-07-27 MED ORDER — ROSUVASTATIN CALCIUM 5 MG PO TABS: 5.0000 mg | ORAL_TABLET | Freq: Every day | ORAL | Status: AC

## 2013-07-29 ENCOUNTER — Encounter (HOSPITAL_COMMUNITY)
Admission: RE | Admit: 2013-07-29 | Discharge: 2013-07-29 | Disposition: A | Discharge status: Home / Self Care | Payer: Medicare Other | Source: Ambulatory Visit | Attending: Cardiology | Admitting: Cardiology

## 2013-08-01 ENCOUNTER — Encounter (HOSPITAL_COMMUNITY)
Admission: RE | Admit: 2013-08-01 | Discharge: 2013-08-01 | Disposition: A | Discharge status: Home / Self Care | Payer: Medicare Other | Source: Ambulatory Visit | Attending: Cardiology | Admitting: Cardiology

## 2013-08-03 ENCOUNTER — Encounter (HOSPITAL_COMMUNITY)
Admission: RE | Admit: 2013-08-03 | Discharge: 2013-08-03 | Disposition: A | Discharge status: Home / Self Care | Payer: Medicare Other | Source: Ambulatory Visit | Attending: Cardiology | Admitting: Cardiology

## 2013-08-05 ENCOUNTER — Encounter (HOSPITAL_COMMUNITY)
Admission: RE | Admit: 2013-08-05 | Discharge: 2013-08-05 | Disposition: A | Discharge status: Home / Self Care | Payer: Medicare Other | Source: Ambulatory Visit | Attending: Cardiology | Admitting: Cardiology

## 2013-08-08 ENCOUNTER — Encounter (HOSPITAL_COMMUNITY): Payer: Medicare Other

## 2013-08-10 ENCOUNTER — Encounter (HOSPITAL_COMMUNITY)
Admission: RE | Admit: 2013-08-10 | Discharge: 2013-08-10 | Disposition: A | Discharge status: Home / Self Care | Payer: Medicare Other | Source: Ambulatory Visit | Attending: Cardiology | Admitting: Cardiology

## 2013-08-12 ENCOUNTER — Encounter (HOSPITAL_COMMUNITY)
Admission: RE | Admit: 2013-08-12 | Discharge: 2013-08-12 | Disposition: A | Discharge status: Home / Self Care | Payer: Medicare Other | Source: Ambulatory Visit | Attending: Cardiology | Admitting: Cardiology

## 2013-08-15 ENCOUNTER — Encounter (HOSPITAL_COMMUNITY)
Admission: RE | Admit: 2013-08-15 | Discharge: 2013-08-15 | Disposition: A | Discharge status: Home / Self Care | Payer: Medicare Other | Source: Ambulatory Visit | Attending: Cardiology | Admitting: Cardiology

## 2013-08-15 DIAGNOSIS — I214 Non-ST elevation (NSTEMI) myocardial infarction: Secondary | ICD-10-CM | POA: Insufficient documentation

## 2013-08-17 ENCOUNTER — Encounter (HOSPITAL_COMMUNITY)
Admission: RE | Admit: 2013-08-17 | Discharge: 2013-08-17 | Disposition: A | Discharge status: Home / Self Care | Payer: Medicare Other | Source: Ambulatory Visit | Attending: Cardiology | Admitting: Cardiology

## 2013-08-19 ENCOUNTER — Encounter (HOSPITAL_COMMUNITY)
Admission: RE | Admit: 2013-08-19 | Discharge: 2013-08-19 | Disposition: A | Discharge status: Home / Self Care | Payer: Medicare Other | Source: Ambulatory Visit | Attending: Cardiology | Admitting: Cardiology

## 2013-08-22 ENCOUNTER — Encounter (HOSPITAL_COMMUNITY)
Admission: RE | Admit: 2013-08-22 | Discharge: 2013-08-22 | Disposition: A | Discharge status: Home / Self Care | Payer: Medicare Other | Source: Ambulatory Visit | Attending: Cardiology | Admitting: Cardiology

## 2013-08-24 ENCOUNTER — Encounter (HOSPITAL_COMMUNITY)
Admission: RE | Admit: 2013-08-24 | Discharge: 2013-08-24 | Disposition: A | Discharge status: Home / Self Care | Payer: Medicare Other | Source: Ambulatory Visit | Attending: Cardiology | Admitting: Cardiology

## 2013-08-26 ENCOUNTER — Encounter (HOSPITAL_COMMUNITY)
Admission: RE | Admit: 2013-08-26 | Discharge: 2013-08-26 | Disposition: A | Discharge status: Home / Self Care | Payer: Medicare Other | Source: Ambulatory Visit | Attending: Cardiology | Admitting: Cardiology

## 2013-08-29 ENCOUNTER — Encounter (HOSPITAL_COMMUNITY)
Admission: RE | Admit: 2013-08-29 | Discharge: 2013-08-29 | Disposition: A | Discharge status: Home / Self Care | Payer: Medicare Other | Source: Ambulatory Visit | Attending: Cardiology | Admitting: Cardiology

## 2013-08-30 NOTE — Addendum Note (Signed)
Encounter addended by: Norlene Duel, RN on: 08/30/2013  9:48 AM<BR>     Documentation filed: Notes Section

## 2013-08-30 NOTE — Progress Notes (Signed)
Cardiac Rehabilitation Program Outcomes Report   Orientation:  07/07/2013 Halfway report: 08/24/2013 Graduate Date:  tbd Discharge Date:  tbd # of sessions completed: 18 DX: CAD, CABG X 2 and NSTEMI  Cardiologist: Derrell Lolling MD:  Hermelinda Medicus Class Time:  11:00  A.  Exercise Program:  Tolerates exercise @ 3.82 METS for 15 minutes  B.  Mental Health:  Good mental attitude  C.  Education/Instruction/Skills  Accurately checks own pulse.  Rest:  75  Exercise:  88, Knows THR for exercise and Uses Perceived Exertion Scale and/or Dyspnea Scale  Uses Perceived Exertion Scale and/or Dyspnea Scale  D.  Nutrition/Weight Control/Body Composition:  Adherence to prescribed nutrition program: good    E.  Blood Lipids    Lab Results  Component Value Date   CHOL 119 05/12/2013   HDL 15* 05/12/2013   LDLCALC 78 05/12/2013   TRIG 131 05/12/2013   CHOLHDL 7.9 05/12/2013    F.  Lifestyle Changes:  Making positive lifestyle changes and Not smoking:  Quit 1975  G.  Symptoms noted with exercise:  Asymptomatic  Report Completed By:  Oletta Lamas. Kenyata Napier RN   Comments:  This is patients halfway report. He has done well while in rehab. His resting HR was 75 and resting BP was 114/78 and peak Hr was 88 and peak BP was 108/72. A graduation report will follow upon his 80 visit.

## 2013-08-30 NOTE — Progress Notes (Signed)
Cardiac Rehabilitation Program Outcomes Report   Orientation:  07/07/2013 1 st week report:07/18/2013 Graduate Date:  tbd Discharge Date:  tbd # of sessions completed: 3 DX: CAD, CABG X 2 NSTEMI  Cardiologist: Derrell Lolling MD:  Hermelinda Medicus Class Time:  11:00  A.  Exercise Program:  Tolerates exercise @ 3.71 METS for 15 minutes and Walk Test Results:  Pre: Pre Walk Test: Rest HR 70, BP 90/62, O2 96%, RPE 6 and RPD 6. 6 minutes HR 100, BP 100/70, O2 95%, RPE 11 and RPD 11, Post Hr 76, BP 108/62 ,O2 97%, RPE 6 and RPD 6. Walked 630feet at 1.23 MPH and 1.9 METS.   B.  Mental Health:  Good mental attitude  C.  Education/Instruction/Skills  Accurately checks own pulse.  Rest:  71  Exercise:  92, Knows THR for exercise and Uses Perceived Exertion Scale and/or Dyspnea Scale  Uses Perceived Exertion Scale and/or Dyspnea Scale  D.  Nutrition/Weight Control/Body Composition:  Adherence to prescribed nutrition program: good    E.  Blood Lipids    Lab Results  Component Value Date   CHOL 119 05/12/2013   HDL 15* 05/12/2013   LDLCALC 78 05/12/2013   TRIG 131 05/12/2013   CHOLHDL 7.9 05/12/2013    F.  Lifestyle Changes:  Making positive lifestyle changes and Not smoking:  Quit 1975  G.  Symptoms noted with exercise:  Asymptomatic  Report Completed By:  Oletta Lamas. Tannisha Kennington RN   Comments:  This is patients 1st week report. He has done well his first week.He achieved a peak METS of 3.71. His resting HR was 71 and resting BP was 110/60, his peak HR was 92 and peak BP was 120/66. A halfway report will follow upon his 18th visit.

## 2013-08-30 NOTE — Addendum Note (Signed)
Encounter addended by: Norlene Duel, RN on: 08/30/2013  9:49 AM<BR>     Documentation filed: Clinical Notes

## 2013-08-31 ENCOUNTER — Encounter (HOSPITAL_COMMUNITY)
Admission: RE | Admit: 2013-08-31 | Discharge: 2013-08-31 | Disposition: A | Discharge status: Home / Self Care | Payer: Medicare Other | Source: Ambulatory Visit | Attending: Cardiology | Admitting: Cardiology

## 2013-09-02 ENCOUNTER — Encounter (HOSPITAL_COMMUNITY)
Admission: RE | Admit: 2013-09-02 | Discharge: 2013-09-02 | Disposition: A | Discharge status: Home / Self Care | Payer: Medicare Other | Source: Ambulatory Visit | Attending: Cardiology | Admitting: Cardiology

## 2013-09-05 ENCOUNTER — Encounter (HOSPITAL_COMMUNITY)
Admission: RE | Admit: 2013-09-05 | Discharge: 2013-09-05 | Disposition: A | Discharge status: Home / Self Care | Payer: Medicare Other | Source: Ambulatory Visit | Attending: Cardiology | Admitting: Cardiology

## 2013-09-07 ENCOUNTER — Encounter (HOSPITAL_COMMUNITY)
Admission: RE | Admit: 2013-09-07 | Discharge: 2013-09-07 | Disposition: A | Discharge status: Home / Self Care | Payer: Medicare Other | Source: Ambulatory Visit | Attending: Cardiology | Admitting: Cardiology

## 2013-09-09 ENCOUNTER — Encounter (HOSPITAL_COMMUNITY)
Admission: RE | Admit: 2013-09-09 | Discharge: 2013-09-09 | Disposition: A | Discharge status: Home / Self Care | Payer: Medicare Other | Source: Ambulatory Visit | Attending: Cardiology | Admitting: Cardiology

## 2013-09-12 ENCOUNTER — Encounter (HOSPITAL_COMMUNITY)
Admission: RE | Admit: 2013-09-12 | Discharge: 2013-09-12 | Disposition: A | Discharge status: Home / Self Care | Payer: Medicare Other | Source: Ambulatory Visit | Attending: Cardiology | Admitting: Cardiology

## 2013-09-14 ENCOUNTER — Encounter (HOSPITAL_COMMUNITY)
Admission: RE | Admit: 2013-09-14 | Discharge: 2013-09-14 | Disposition: A | Discharge status: Home / Self Care | Payer: Medicare Other | Source: Ambulatory Visit | Attending: Cardiology | Admitting: Cardiology

## 2013-09-14 DIAGNOSIS — I214 Non-ST elevation (NSTEMI) myocardial infarction: Secondary | ICD-10-CM | POA: Diagnosis not present

## 2013-09-16 ENCOUNTER — Encounter (HOSPITAL_COMMUNITY): Payer: Medicare Other

## 2013-09-19 ENCOUNTER — Encounter (HOSPITAL_COMMUNITY)
Admission: RE | Admit: 2013-09-19 | Discharge: 2013-09-19 | Disposition: A | Discharge status: Home / Self Care | Payer: Medicare Other | Source: Ambulatory Visit | Attending: Cardiology | Admitting: Cardiology

## 2013-09-19 DIAGNOSIS — I214 Non-ST elevation (NSTEMI) myocardial infarction: Secondary | ICD-10-CM | POA: Diagnosis not present

## 2013-09-21 ENCOUNTER — Encounter (HOSPITAL_COMMUNITY)
Admission: RE | Admit: 2013-09-21 | Discharge: 2013-09-21 | Disposition: A | Discharge status: Home / Self Care | Payer: Medicare Other | Source: Ambulatory Visit | Attending: Cardiology | Admitting: Cardiology

## 2013-09-21 DIAGNOSIS — I214 Non-ST elevation (NSTEMI) myocardial infarction: Secondary | ICD-10-CM | POA: Diagnosis not present

## 2013-09-23 ENCOUNTER — Encounter (HOSPITAL_COMMUNITY)
Admission: RE | Admit: 2013-09-23 | Discharge: 2013-09-23 | Disposition: A | Discharge status: Home / Self Care | Payer: Medicare Other | Source: Ambulatory Visit | Attending: Cardiology | Admitting: Cardiology

## 2013-09-23 DIAGNOSIS — I214 Non-ST elevation (NSTEMI) myocardial infarction: Secondary | ICD-10-CM | POA: Diagnosis not present

## 2013-09-26 ENCOUNTER — Encounter (HOSPITAL_COMMUNITY)
Admission: RE | Admit: 2013-09-26 | Discharge: 2013-09-26 | Disposition: A | Discharge status: Home / Self Care | Payer: Medicare Other | Source: Ambulatory Visit | Attending: Cardiology | Admitting: Cardiology

## 2013-09-26 DIAGNOSIS — I214 Non-ST elevation (NSTEMI) myocardial infarction: Secondary | ICD-10-CM | POA: Diagnosis not present

## 2013-09-28 ENCOUNTER — Encounter (HOSPITAL_COMMUNITY)
Admission: RE | Admit: 2013-09-28 | Discharge: 2013-09-28 | Disposition: A | Discharge status: Home / Self Care | Payer: Medicare Other | Source: Ambulatory Visit | Attending: Cardiology | Admitting: Cardiology

## 2013-09-28 DIAGNOSIS — I214 Non-ST elevation (NSTEMI) myocardial infarction: Secondary | ICD-10-CM | POA: Diagnosis not present

## 2013-09-30 ENCOUNTER — Encounter (HOSPITAL_COMMUNITY)
Admission: RE | Admit: 2013-09-30 | Discharge: 2013-09-30 | Disposition: A | Discharge status: Home / Self Care | Payer: Medicare Other | Source: Ambulatory Visit | Attending: Cardiology | Admitting: Cardiology

## 2013-09-30 DIAGNOSIS — I214 Non-ST elevation (NSTEMI) myocardial infarction: Secondary | ICD-10-CM | POA: Diagnosis not present

## 2013-10-03 ENCOUNTER — Encounter (HOSPITAL_COMMUNITY)
Admission: RE | Admit: 2013-10-03 | Discharge: 2013-10-03 | Disposition: A | Discharge status: Home / Self Care | Payer: Medicare Other | Source: Ambulatory Visit | Attending: Cardiology | Admitting: Cardiology

## 2013-10-03 DIAGNOSIS — I214 Non-ST elevation (NSTEMI) myocardial infarction: Secondary | ICD-10-CM | POA: Diagnosis not present

## 2013-10-05 ENCOUNTER — Encounter (HOSPITAL_COMMUNITY)
Admission: RE | Admit: 2013-10-05 | Discharge: 2013-10-05 | Disposition: A | Discharge status: Home / Self Care | Payer: Medicare Other | Source: Ambulatory Visit | Attending: Cardiology | Admitting: Cardiology

## 2013-10-05 DIAGNOSIS — I214 Non-ST elevation (NSTEMI) myocardial infarction: Secondary | ICD-10-CM | POA: Diagnosis not present

## 2013-10-07 ENCOUNTER — Encounter (HOSPITAL_COMMUNITY)
Admission: RE | Admit: 2013-10-07 | Discharge: 2013-10-07 | Disposition: A | Discharge status: Home / Self Care | Payer: Medicare Other | Source: Ambulatory Visit | Attending: Cardiology | Admitting: Cardiology

## 2013-10-07 DIAGNOSIS — I214 Non-ST elevation (NSTEMI) myocardial infarction: Secondary | ICD-10-CM | POA: Diagnosis not present

## 2013-10-17 ENCOUNTER — Encounter: Payer: Self-pay | Admitting: Cardiology

## 2013-10-17 ENCOUNTER — Ambulatory Visit (INDEPENDENT_AMBULATORY_CARE_PROVIDER_SITE_OTHER): Payer: Medicare Other | Admitting: Cardiology

## 2013-10-17 VITALS — BP 118/76 | HR 76 | Ht 68.0 in | Wt 200.0 lb

## 2013-10-17 DIAGNOSIS — I10 Essential (primary) hypertension: Secondary | ICD-10-CM

## 2013-10-17 DIAGNOSIS — I2510 Atherosclerotic heart disease of native coronary artery without angina pectoris: Secondary | ICD-10-CM

## 2013-10-17 DIAGNOSIS — I25810 Atherosclerosis of coronary artery bypass graft(s) without angina pectoris: Secondary | ICD-10-CM

## 2013-10-17 DIAGNOSIS — I480 Paroxysmal atrial fibrillation: Secondary | ICD-10-CM

## 2013-10-17 DIAGNOSIS — I4891 Unspecified atrial fibrillation: Secondary | ICD-10-CM

## 2013-10-17 NOTE — Patient Instructions (Signed)
Your physician recommends that you schedule a follow-up appointment in: 3 months. You will receive a reminder letter in the mail in about 1-2 months reminding you to call and schedule your appointment. If you don't receive this letter, please contact our office.  Your physician has recommended you make the following change in your medication:  Stop amiodarone. Continue all other medications the same.

## 2013-10-17 NOTE — Progress Notes (Signed)
Patient ID: Francis Wilkinson, male   DOB: 1945/09/23, 68 y.o.   MRN: [id number]    HPI  Patient is seen today to followup coronary disease and atrial fibrillation. He is on several medications for his rheumatoid arthritis. Because of this he is on a low dose of Crestor. He I have lowered his amiodarone to 200 mg daily when I saw him last. He is maintaining sinus rhythm now.  No Known Allergies  Current Outpatient Prescriptions  Medication Sig Dispense Refill  . acetaminophen (TYLENOL) 325 MG tablet Take 325-650 mg by mouth every 6 (six) hours as needed.      [name] amiodarone (PACERONE) 200 MG tablet Take 1 tablet (200 mg total) by mouth daily.  30 tablet  1  . aspirin EC 325 MG EC tablet Take 1 tablet (325 mg total) by mouth daily.  30 tablet  0  . CALCIUM-VITAMIN D PO Take 1 tablet by mouth daily.      . cycloSPORINE (SANDIMMUNE) 100 MG capsule Take 1 capsule by mouth daily.      . folic acid (FOLVITE) 1 MG tablet Take 1 mg by mouth daily.      [name] glimepiride (AMARYL) 4 MG tablet Take 1 tablet by mouth daily.      [name] lisinopril (PRINIVIL,ZESTRIL) 10 MG tablet Take 1 tablet by mouth daily.      . methotrexate 2.5 MG tablet Take 2.5 mg by mouth daily.      . metoprolol tartrate (LOPRESSOR) 25 MG tablet Take 1 tablet (25 mg total) by mouth 2 (two) times daily.  60 tablet  6  . NIFEdipine (PROCARDIA-XL/ADALAT-CC/NIFEDICAL-XL) 30 MG 24 hr tablet Take 30 mg by mouth daily.      . rosuvastatin (CRESTOR) 5 MG tablet Take 1 tablet (5 mg total) by mouth daily.  30 tablet  6   No current facility-administered medications for this visit.    History   Social History  . Marital Status: Married    Spouse Name: N/A    Number of Children: N/A  . Years of Education: N/A   Occupational History  . Not on file.   Social History Main Topics  . Smoking status: Former Smoker -- 2.00 packs/day for 10 years    Types: Cigarettes    Quit date: 09/15/1971  . Smokeless tobacco: Never Used  . Alcohol Use: No    . Drug Use: No  . Sexual Activity: Yes   Other Topics Concern  . Not on file   Social History Narrative  . No narrative on file    Family History  Problem Relation Age of Onset  . Coronary artery disease Brother     Past Medical History  Diagnosis Date  . HTN (hypertension)   . Dyslipidemia   . 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 Diabetes mellitus, type II   . GERD (gastroesophageal reflux disease)   . Rheumatoid arthritis   . CAD (coronary artery disease)   . Hx of CABG     February, 2015  . Mitral regurgitation   . Ejection fraction   . Atrial fibrillation     Past Surgical History  Procedure Laterality Date  . Inguinal hernia repair Left ~ 1970  . Coronary artery bypass graft N/A 05/16/2013    Procedure: CORONARY ARTERY BYPASS GRAFTING (CABG);  Surgeon: [name], MD;  Location: [facility];  Service: Open Heart Surgery;  Laterality: N/A;  Times 2 using left internal  mammary artery and endoscopically harvested right saphenous vein.  . Intraoperative transesophageal echocardiogram N/A 05/16/2013    Procedure: INTRAOPERATIVE TRANSESOPHAGEAL ECHOCARDIOGRAM;  Surgeon: [name], MD;  Location: [facility];  Service: Open Heart Surgery;  Laterality: N/A;    Patient Active Problem List   Diagnosis Date Noted  . H/O amiodarone therapy 07/13/2013  . Cardiomyopathy, ischemic 07/13/2013  . Abnormal chest x-ray 05/25/2013  . CAD (coronary artery disease)   . Hx of CABG   . Mitral regurgitation   . Ejection fraction   . Atrial fibrillation   . Diabetes mellitus 05/11/2013  . Dyslipidemia 05/11/2013  . HTN (hypertension) 05/11/2013  . Rheumatoid arthritis 05/11/2013    ROS   Patient denies fever, chills, headache, sweats, rash, change in vision, change in hearing, chest pain, cough, nausea or vomiting, urinary symptoms. All other systems are reviewed and are negative.  PHYSICAL EXAM   Patient looks good. He is oriented to person time and place. Affect is  normal. He has his arthritic changes of severe rheumatoid arthritis. Head is atraumatic. Lungs are clear. Respiratory effort is nonlabored. There is no jugular venous distention. Cardiac exam reveals S1 and S2. The abdomen is soft. There is no peripheral edema.  Filed Vitals:   10/17/13 1027  BP: 118/76  Pulse: 76  Height: 5\' 8"  (1.727 m)  Weight: 200 lb (90.719 kg)  SpO2: 96%   EKG is done today reviewed by me. There is sinus rhythm. There are nonspecific ST-T wave changes.  ASSESSMENT & PLAN

## 2013-10-17 NOTE — Assessment & Plan Note (Signed)
Blood pressure is controlled. No change in therapy. 

## 2013-10-17 NOTE — Assessment & Plan Note (Signed)
He is stable post CABG.

## 2013-10-17 NOTE — Assessment & Plan Note (Signed)
He is holding sinus rhythm. Amiodarone can now be stopped.

## 2014-02-23 ENCOUNTER — Encounter (HOSPITAL_COMMUNITY): Payer: Self-pay | Admitting: Cardiology

## 2014-05-17 ENCOUNTER — Encounter: Payer: Self-pay | Admitting: *Deleted

## 2014-05-18 ENCOUNTER — Ambulatory Visit (INDEPENDENT_AMBULATORY_CARE_PROVIDER_SITE_OTHER): Payer: Medicare Other | Admitting: Cardiology

## 2014-05-18 ENCOUNTER — Encounter: Payer: Self-pay | Admitting: Cardiology

## 2014-05-18 VITALS — BP 111/76 | HR 75 | Ht 68.0 in | Wt 193.8 lb

## 2014-05-18 DIAGNOSIS — I071 Rheumatic tricuspid insufficiency: Secondary | ICD-10-CM | POA: Insufficient documentation

## 2014-05-18 DIAGNOSIS — I4891 Unspecified atrial fibrillation: Secondary | ICD-10-CM

## 2014-05-18 DIAGNOSIS — I5022 Chronic systolic (congestive) heart failure: Secondary | ICD-10-CM

## 2014-05-18 DIAGNOSIS — I272 Pulmonary hypertension, unspecified: Secondary | ICD-10-CM | POA: Insufficient documentation

## 2014-05-18 DIAGNOSIS — I25810 Atherosclerosis of coronary artery bypass graft(s) without angina pectoris: Secondary | ICD-10-CM

## 2014-05-18 DIAGNOSIS — M069 Rheumatoid arthritis, unspecified: Secondary | ICD-10-CM

## 2014-05-18 DIAGNOSIS — I255 Ischemic cardiomyopathy: Secondary | ICD-10-CM

## 2014-05-18 DIAGNOSIS — I340 Nonrheumatic mitral (valve) insufficiency: Secondary | ICD-10-CM

## 2014-05-18 MED ORDER — CARVEDILOL 6.25 MG PO TABS: 6.2500 mg | ORAL_TABLET | Freq: Two times a day (BID) | ORAL | Status: DC

## 2014-05-18 NOTE — Patient Instructions (Signed)
Your physician recommends that you schedule a follow-up appointment in: 3 weeks. Your physician has recommended you make the following change in your medication: Stop metoprolol tartrate. Start carvedilol 6.25 mg twice daily. Continue all other medications the same. Your physician recommends that you have lab work today to check your BMET.

## 2014-05-18 NOTE — Assessment & Plan Note (Addendum)
After his recent hospitalization he has been stable with the addition of a diuretic. He is stable today. The patient and the family hoped to travel into [place] this summer. I will plan to give him my exact recommendations in several months.

## 2014-05-18 NOTE — Assessment & Plan Note (Signed)
He has valvular heart disease. It appears now that this is worse and he has pulmonary hypertension. Some of these findings may have been associated with his acute event in the hospital. I will consider an echo sent to follow-up in a stable situation.

## 2014-05-18 NOTE — Assessment & Plan Note (Signed)
Patient is on medications for this.  As part of today's evaluation I spent greater than 25 minutes with his total care. More than half of this time is been with direct contact with the patient and his wife discussing his hospitalization and the plan of care.

## 2014-05-18 NOTE — Assessment & Plan Note (Signed)
Initially the patient's ejection fraction was in the 45% range around the time of his bypass surgery in 2015. He has not had an echo since then until his very recent hospitalization. EF now is in the 25-30% range. I have no way of knowing when his ejection fraction decreased. He is on an ACE inhibitor. Today I will switch his metoprolol to carvedilol. I'll try to titrate up his medications and consider when follow-up echo is to be done.

## 2014-05-18 NOTE — Assessment & Plan Note (Signed)
Initially in February, 2015, the patient had chest discomfort with his non-STEMI. He underwent CABG at that time. He has not had any recurring chest discomfort.

## 2014-05-18 NOTE — Progress Notes (Signed)
Cardiology Office Note   Date:  05/18/2014   ID:  Francis Wilkinson, DOB 27-Jul-1945, MRN [id number]  PCP:  [name], MD  Cardiologist:  Dola Argyle, MD   Chief Complaint  Patient presents with  . Appointment    Follow-up coronary disease. Assess patient after hospitalization in [place]      History of Present Illness: Francis Wilkinson is a 69 y.o. male who presents to follow-up his coronary disease. He is also seen to follow-up a hospitalization in [facility]. We have received records from this hospitalization. I have reviewed them carefully. He was discharged from the hospital on May 06, 2014. He had an episode of respiratory insufficiency with congestive heart failure.( He tells me today that he had had increasing shortness of breath at home for several weeks before finally seeking medical care.) He was diuresed in the hospital. Two-dimensional echo at [facility] revealed an ejection fraction of 25-30%. This is lower than the prior ejection fraction information that we had. He had moderately severe mitral regurgitation. He had moderately severe tricuspid regurgitation. There was also severe pulmonary hypertension with PA pressure of 69 mmHg. There was no documented myocardial infarction during that admission.  In the past with his cardiac symptoms, he had chest pain. He has not had any chest pain associated with the recent events.    Past Medical History  Diagnosis Date  . HTN (hypertension)   . Dyslipidemia   . 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 Diabetes mellitus, type II   . GERD (gastroesophageal reflux disease)   . Rheumatoid arthritis   . CAD (coronary artery disease)   . Hx of CABG     February, 2015  . Mitral regurgitation   . Ejection fraction   . Atrial fibrillation     Past Surgical History  Procedure Laterality Date  . Inguinal hernia repair Left ~ 1970  . Coronary artery bypass graft N/A 05/16/2013   Procedure: CORONARY ARTERY BYPASS GRAFTING (CABG);  Surgeon: [name], MD;  Location: [facility];  Service: Open Heart Surgery;  Laterality: N/A;  Times 2 using left internal mammary artery and endoscopically harvested right saphenous vein.  . Intraoperative transesophageal echocardiogram N/A 05/16/2013    Procedure: INTRAOPERATIVE TRANSESOPHAGEAL ECHOCARDIOGRAM;  Surgeon: [name], MD;  Location: [place];  Service: Open Heart Surgery;  Laterality: N/A;  . Left heart catheterization with coronary angiogram N/A 05/12/2013    Procedure: LEFT HEART CATHETERIZATION WITH CORONARY ANGIOGRAM;  Surgeon: [name], MD;  [facility];  Service: Cardiovascular;  Laterality: N/A;    Patient Active Problem List   Diagnosis Date Noted  . Tricuspid regurgitation 05/18/2014  . Pulmonary hypertension 05/18/2014  . H/O amiodarone therapy 07/13/2013  . Cardiomyopathy, ischemic 07/13/2013  . Abnormal chest x-ray 05/25/2013  . CAD (coronary artery disease)   . Hx of CABG   . Mitral regurgitation   . Ejection fraction   . Atrial fibrillation   . Diabetes mellitus 05/11/2013  . Dyslipidemia 05/11/2013  . HTN (hypertension) 05/11/2013  . Rheumatoid arthritis 05/11/2013      Current Outpatient Prescriptions  Medication Sig Dispense Refill  . acetaminophen (TYLENOL) 325 MG tablet Take 325-650 mg by mouth every 6 (six) hours as needed.    [name] aspirin EC 325 MG EC tablet Take 1 tablet (325 mg total) by mouth daily. 30 tablet 0  . CALCIUM-VITAMIN D PO Take 1 tablet by mouth daily.    [name]  cycloSPORINE (SANDIMMUNE) 100 MG capsule Take 1 capsule by mouth daily.    . folic acid (FOLVITE) 1 MG tablet Take 1 mg by mouth daily.    . furosemide (LASIX) 40 MG tablet Take 40 mg by mouth every morning.    [name] glimepiride (AMARYL) 4 MG tablet Take 1 tablet by mouth daily.    [name] lisinopril (PRINIVIL,ZESTRIL) 10 MG tablet Take 1 tablet by mouth daily.    . methotrexate 2.5 MG tablet Take 2.5 mg by mouth daily.     . metoprolol tartrate (LOPRESSOR) 25 MG tablet Take 1 tablet (25 mg total) by mouth 2 (two) times daily. 60 tablet 6  . NIFEdipine (PROCARDIA-XL/ADALAT-CC/NIFEDICAL-XL) 30 MG 24 hr tablet Take 30 mg by mouth daily.    . rosuvastatin (CRESTOR) 5 MG tablet Take 1 tablet (5 mg total) by mouth daily. 30 tablet 6   No current facility-administered medications for this visit.    Allergies:   Review of patient's allergies indicates no known allergies.    Social History:  The patient  reports that he quit smoking about 42 years ago. His smoking use included Cigarettes. He has a 20 pack-year smoking history. He has never used smokeless tobacco. He reports that he does not drink alcohol or use illicit drugs.   Family History:  The patient's family history includes Coronary artery disease in his brother.    ROS:  Please see the history of present illness. Patient denies fever, chills, headache, sweats, rash, change in vision, change in hearing, chest pain, cough, nausea or vomiting, urinary symptoms. All other systems are reviewed and are negative.       PHYSICAL EXAM: VS:  Ht 5\' 8"  (1.727 m)  Wt 193 lb 12.8 oz (87.907 kg)  BMI 29.47 kg/m2 , Patient is stable. He is oriented to person time and place. Affect is normal. Head is atraumatic. Sclera and conjunctiva are normal. There is no jugulovenous distention. Lungs are clear. Respiratory effort is nonlabored. Cardiac exam reveals S1 and S2. The abdomen is soft. The is no peripheral edema. The patient has classic changes in his hands of chronic rheumatoid arthritis. There are no skin rashes. Neurologic is grossly intact.  EKG:   EKG is not done today. I reviewed an EKG from his recent hospitalization. The quality of the copy is poor but the QRS is not changed.   Recent Labs: 05/19/2013: BUN 26*; Creatinine 1.03; Hemoglobin 11.3*; Platelets 226; Potassium 4.6; Sodium 139    Lipid Panel    Component Value Date/Time   CHOL 119 05/12/2013 0043    TRIG 131 05/12/2013 0043   HDL 15* 05/12/2013 0043   CHOLHDL 7.9 05/12/2013 0043   VLDL 26 05/12/2013 0043   LDLCALC 78 05/12/2013 0043      Wt Readings from Last 3 Encounters:  05/18/14 193 lb 12.8 oz (87.907 kg)  10/17/13 200 lb (90.719 kg)  07/13/13 196 lb (88.905 kg)      Current medicines are reviewed  The patient understands his medications.     ASSESSMENT AND PLAN:

## 2014-05-26 ENCOUNTER — Telehealth: Payer: Self-pay | Admitting: *Deleted

## 2014-05-26 NOTE — Telephone Encounter (Signed)
-----   Message from Carlena Bjornstad, MD sent at 05/24/2014  7:25 PM EST ----- Notify patient that lab work is OK.

## 2014-05-26 NOTE — Telephone Encounter (Signed)
Patient informed. 

## 2014-06-21 ENCOUNTER — Encounter: Payer: Self-pay | Admitting: Cardiology

## 2014-06-21 ENCOUNTER — Ambulatory Visit (INDEPENDENT_AMBULATORY_CARE_PROVIDER_SITE_OTHER): Payer: Medicare Other | Admitting: Cardiology

## 2014-06-21 VITALS — BP 100/70 | HR 70 | Ht 69.0 in | Wt 194.0 lb

## 2014-06-21 DIAGNOSIS — I10 Essential (primary) hypertension: Secondary | ICD-10-CM

## 2014-06-21 DIAGNOSIS — I480 Paroxysmal atrial fibrillation: Secondary | ICD-10-CM | POA: Diagnosis not present

## 2014-06-21 DIAGNOSIS — I25810 Atherosclerosis of coronary artery bypass graft(s) without angina pectoris: Secondary | ICD-10-CM

## 2014-06-21 DIAGNOSIS — I493 Ventricular premature depolarization: Secondary | ICD-10-CM | POA: Diagnosis not present

## 2014-06-21 DIAGNOSIS — I255 Ischemic cardiomyopathy: Secondary | ICD-10-CM | POA: Diagnosis not present

## 2014-06-21 DIAGNOSIS — N183 Chronic kidney disease, stage 3 unspecified: Secondary | ICD-10-CM

## 2014-06-21 DIAGNOSIS — I5022 Chronic systolic (congestive) heart failure: Secondary | ICD-10-CM

## 2014-06-21 DIAGNOSIS — I340 Nonrheumatic mitral (valve) insufficiency: Secondary | ICD-10-CM

## 2014-06-21 MED ORDER — FUROSEMIDE 20 MG PO TABS: 20.0000 mg | ORAL_TABLET | Freq: Every day | ORAL | Status: DC

## 2014-06-21 NOTE — Assessment & Plan Note (Signed)
I'm concerned that his most recent creatinine was 1.8. Repeat is to be done today. In addition his Lasix will be dropped from 40-20 daily. After I see his labs I'll decide about the timing of follow-up labs.

## 2014-06-21 NOTE — Progress Notes (Signed)
Cardiology Office Note   Date:  06/21/2014   ID:  Francis Wilkinson, DOB 10-03-1945, MRN [id number]  PCP:  [name], MD  Cardiologist:  Dola Argyle, MD   Chief Complaint  Patient presents with  . Appointment    Follow-up coronary artery disease      History of Present Illness: Francis Wilkinson is a 69 y.o. male who presents today to follow-up coronary disease. I saw the patient last May 18, 2014. In my note I outlined his hospitalization in [place] on May 06, 2014. The patient had CHF. He was diuresed. There was pulmonary hypertension. When I saw him in the office May 18, 2014. He was stable. Labs were checked. In the hospital his creatinine had been as high as 1.6. Checked on March 3 was 1.8. Because there was not a substantial difference, I chose at that time not to adjust his meds. I have re-reviewed all of his prior labs and his creatinine was definitely lower previously. He feels well at this time. He is not having chest pain or shortness of breath. He is not having any palpitations. There's been no syncope or presyncope.    Past Medical History  Diagnosis Date  . HTN (hypertension)   . Dyslipidemia   . 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 Diabetes mellitus, type II   . GERD (gastroesophageal reflux disease)   . Rheumatoid arthritis   . CAD (coronary artery disease)   . Hx of CABG     February, 2015  . Mitral regurgitation   . Ejection fraction   . Atrial fibrillation     Past Surgical History  Procedure Laterality Date  . Inguinal hernia repair Left ~ 1970  . Coronary artery bypass graft N/A 05/16/2013    Procedure: CORONARY ARTERY BYPASS GRAFTING (CABG);  Surgeon: [name], MD;  Location: [place];  Service: Open Heart Surgery;  Laterality: N/A;  Times 2 using left internal mammary artery and endoscopically harvested right saphenous vein.  . Intraoperative transesophageal echocardiogram N/A 05/16/2013    Procedure: INTRAOPERATIVE  TRANSESOPHAGEAL ECHOCARDIOGRAM;  Surgeon: [name], MD;  Location: [place];  Service: Open Heart Surgery;  Laterality: N/A;  . Left heart catheterization with coronary angiogram N/A 05/12/2013    Procedure: LEFT HEART CATHETERIZATION WITH CORONARY ANGIOGRAM;  Surgeon: [name], MD;  Location: [facility] CATH LAB;  Service: Cardiovascular;  Laterality: N/A;    Patient Active Problem List   Diagnosis Date Noted  . Tricuspid regurgitation 05/18/2014  . Pulmonary hypertension 05/18/2014  . Chronic systolic CHF (congestive heart failure) 05/18/2014  . H/O amiodarone therapy 07/13/2013  . Cardiomyopathy, ischemic 07/13/2013  . Abnormal chest x-ray 05/25/2013  . CAD (coronary artery disease)   . Hx of CABG   . Mitral regurgitation   . Ejection fraction   . Atrial fibrillation   . Diabetes mellitus 05/11/2013  . Dyslipidemia 05/11/2013  . HTN (hypertension) 05/11/2013  . Rheumatoid arthritis 05/11/2013      Current Outpatient Prescriptions  Medication Sig Dispense Refill  . acetaminophen (TYLENOL) 325 MG tablet Take 325-650 mg by mouth every 6 (six) hours as needed.    [name] aspirin EC 325 MG EC tablet Take 1 tablet (325 mg total) by mouth daily. 30 tablet 0  . CALCIUM-VITAMIN D PO Take 1 tablet by mouth daily.    . carvedilol (COREG) 6.25 MG tablet Take 1 tablet (6.25 mg total) by mouth 2 (two) times daily. [place]  tablet 3  . cycloSPORINE (SANDIMMUNE) 100 MG capsule Take 1 capsule by mouth daily.    . folic acid (FOLVITE) 1 MG tablet Take 1 mg by mouth daily.    [name] glimepiride (AMARYL) 4 MG tablet Take 1 tablet by mouth daily.    [name] lisinopril (PRINIVIL,ZESTRIL) 10 MG tablet Take 1 tablet by mouth daily.    . methotrexate 2.5 MG tablet Take 2.5 mg by mouth daily.    [name] NIFEdipine (PROCARDIA-XL/ADALAT-CC/NIFEDICAL-XL) 30 MG 24 hr tablet Take 30 mg by mouth daily.    . rosuvastatin (CRESTOR) 5 MG tablet Take 1 tablet (5 mg total) by mouth daily. 30 tablet 6  . furosemide (LASIX) 20 MG tablet  Take 1 tablet (20 mg total) by mouth daily. 90 tablet 1   No current facility-administered medications for this visit.    Allergies:   Review of patient's allergies indicates no known allergies.    Social History:  The patient  reports that he quit smoking about 42 years ago. His smoking use included Cigarettes. He has a 20 pack-year smoking history. He has never used smokeless tobacco. He reports that he does not drink alcohol or use illicit drugs.   Family History:  The patient's family history includes Coronary artery disease in his brother.    ROS:  Please see the history of present illness.  Patient denies fever, chills, headache, sweats, rash, change in vision, change in hearing, chest pain, cough, nausea or vomiting, urinary symptoms. All other systems are reviewed and are negative.      PHYSICAL EXAM: VS:  BP 100/70 mmHg  Pulse 70  Ht 5\' 9"  (1.753 m)  Wt 194 lb (87.998 kg)  BMI 28.64 kg/m2  SpO2 95% , The patient is here with his wife today. He is stable. He is oriented to person time and place. Affect is normal. Head is atraumatic. Sclera and conjunctiva are normal. There is no jugulovenous distention. Lungs are clear. Respiratory effort is nonlabored. Cardiac exam reveals an S1 and S2. The rhythm is regular. Abdomen is soft. There is no peripheral edema. There are no musculoskeletal deformities. There are no skin rashes. Neurologic is grossly intact. The patient did have multiple premature beats on physical exam.  EKG:   EKG was done today and reviewed by me. A brief rhythm strip was also done. I decided to do this because of the premature beats that I heard on physical exam. He does have PVCs with moderate frequency.   Recent Labs: No results found for requested labs within last 365 days.    Lipid Panel    Component Value Date/Time   CHOL 119 05/12/2013 0043   TRIG 131 05/12/2013 0043   HDL 15* 05/12/2013 0043   CHOLHDL 7.9 05/12/2013 0043   VLDL 26 05/12/2013 0043     LDLCALC 78 05/12/2013 0043      Wt Readings from Last 3 Encounters:  06/21/14 194 lb (87.998 kg)  05/18/14 193 lb 12.8 oz (87.907 kg)  10/17/13 200 lb (90.719 kg)      Current medicines are reviewed  The patient and his wife understand his medicines.     ASSESSMENT AND PLAN:

## 2014-06-21 NOTE — Assessment & Plan Note (Addendum)
Volume is stable. I'm hopeful he may not need 40 of Lasix daily. It is being decreased to 20.  As part of today's evaluation I spent greater than 25 minutes with his total care. More than half of this time was with direct discussion with the patient and his wife. We talked about his medications and follow-up labs. We also talked about the trip that he hopes to take driving across country in July. I feel that he will be stable for this. However I have my concerns with him being that far from his regular health care.

## 2014-06-21 NOTE — Assessment & Plan Note (Signed)
I've been concerned because of the decreased ejection fraction that was noted in February, 2016. Ejection fracture for his CABG in 2015 was in the range of 45%. I hope to adjust his meds further and then follow-up with a repeat echo. This has not yet been done. Chosen not to adjust his medicines any further today until we have a better understanding of his renal status.

## 2014-06-21 NOTE — Assessment & Plan Note (Signed)
Patient has not had any significant return of atrial fibrillation. This was noted originally perioperatively at the time of his bypass surgery.

## 2014-06-21 NOTE — Assessment & Plan Note (Addendum)
Patient underwent CABG February, 2015. He is not having any chest pain at this time. However his drop in ejection fraction documented in February is not completely explained. We may consider follow-up catheterization.

## 2014-06-21 NOTE — Assessment & Plan Note (Signed)
The patient has a moderate volume of PVCs at rest. This is concerning with his left ventricular dysfunction. The issue will be followed very carefully.

## 2014-06-21 NOTE — Assessment & Plan Note (Signed)
He had significant mitral regurgitation when  hospitalized for CHF February, 2016. I'm hoping that this will be improved when we do a follow-up echo.

## 2014-06-21 NOTE — Patient Instructions (Signed)
Your physician recommends that you schedule a follow-up appointment in: July 06, 2014 with Dr. Ron Parker. Your physician has recommended you make the following change in your medication:  Decrease your furosemide to 20 mg daily. You may break your 40 mg tablets in half daily until they are finished.  Continue all other medications the same. Your physician recommends that you have lab work today to check your BMET.

## 2014-06-30 ENCOUNTER — Telehealth: Payer: Self-pay | Admitting: *Deleted

## 2014-06-30 MED ORDER — LISINOPRIL 20 MG PO TABS: 20.0000 mg | ORAL_TABLET | Freq: Every day | ORAL | Status: DC

## 2014-06-30 NOTE — Telephone Encounter (Signed)
-----   Message from Carlena Bjornstad, MD sent at 06/30/2014  9:03 AM EDT ----- Please notify the patient that his recent labs were good. I am pleased that his kidney function looked better than prior labs. I want to make a change in his meds. I want to discontinue Nifedipine. On the first day that he does not take Nifedipine, I want him to increase his Lotensin from 10 mg a day to 20 mg a day.

## 2014-06-30 NOTE — Telephone Encounter (Signed)
Patient informed and verbalized understanding of plan. Clarified with MD Dr. Ron Parker that patient isn't on lotensin but lisinopril. Per Dr. Ron Parker, patient is to increase lisinopril not lotensin since he isn't on this medication.

## 2014-07-06 ENCOUNTER — Encounter: Payer: Self-pay | Admitting: Cardiology

## 2014-07-06 ENCOUNTER — Ambulatory Visit (INDEPENDENT_AMBULATORY_CARE_PROVIDER_SITE_OTHER): Payer: Medicare Other | Admitting: Cardiology

## 2014-07-06 VITALS — BP 100/60 | HR 65 | Ht 69.0 in | Wt 195.0 lb

## 2014-07-06 DIAGNOSIS — I5022 Chronic systolic (congestive) heart failure: Secondary | ICD-10-CM | POA: Diagnosis not present

## 2014-07-06 DIAGNOSIS — N183 Chronic kidney disease, stage 3 unspecified: Secondary | ICD-10-CM

## 2014-07-06 DIAGNOSIS — I480 Paroxysmal atrial fibrillation: Secondary | ICD-10-CM | POA: Diagnosis not present

## 2014-07-06 DIAGNOSIS — I255 Ischemic cardiomyopathy: Secondary | ICD-10-CM | POA: Diagnosis not present

## 2014-07-06 NOTE — Assessment & Plan Note (Addendum)
Repeat labs show that his creatinine had improved.[name]

## 2014-07-06 NOTE — Assessment & Plan Note (Addendum)
Following him status is stable. No change in therapy today. Initial systolic blood pressure was 92. Follow-up blood pressure is 106 systolic.

## 2014-07-06 NOTE — Assessment & Plan Note (Signed)
He is holding sinus rhythm.

## 2014-07-06 NOTE — Patient Instructions (Signed)
Your physician recommends that you continue on your current medications as directed. Please refer to the Current Medication list given to you today. Your physician recommends that you schedule a follow-up appointment in: Jul 17, 2014 with Dr. Ron Parker.

## 2014-07-06 NOTE — Assessment & Plan Note (Addendum)
I'm continuing to adjust his medications. Blood pressure today is 100/60. His pulse is 65. I'm not inclined to push his medicines further today. I will see him back soon to consider the next adjustment. I will also consider when his next echo will be done. We continue to try to keep him stable and plan for a family trip in July.[name]

## 2014-07-06 NOTE — Progress Notes (Signed)
Cardiology Office Note   Date:  07/06/2014   ID:  Francis Wilkinson, Francis Wilkinson [birth year], MRN [id number]  PCP:  [name], MD  Cardiologist:  Dola Argyle, MD   Chief Complaint  Patient presents with  . Appointment    Follow-up coronary artery disease      History of Present Illness: Francis Wilkinson is a 69 y.o. male who presents today to follow up coronary disease and cardiomyopathy. I saw him last June 21, 2014. We checked labs that day his renal function was stable. With this in mind we made further adjustments in his medications. Nifedipine was stopped completely. Lisinopril was increased from 10-20 mg daily. Since then he is feeling well. He is not having any dizziness, syncope, or presyncope. He does notice that with exertion he becomes shortness of breath after a certain period of time. This is definitely improved from prior months, but it still occurs. He rests and then is able to continue.    Past Medical History  Diagnosis Date  . HTN (hypertension)   . Dyslipidemia   . 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 Diabetes mellitus, type II   . GERD (gastroesophageal reflux disease)   . Rheumatoid arthritis   . CAD (coronary artery disease)   . Hx of CABG     February, 2015  . Mitral regurgitation   . Ejection fraction   . Atrial fibrillation     Past Surgical History  Procedure Laterality Date  . Inguinal hernia repair Left ~ 1970  . Coronary artery bypass graft N/A 05/16/2013    Procedure: CORONARY ARTERY BYPASS GRAFTING (CABG);  Surgeon: [name], MD;  Location: [place];  Service: Open Heart Surgery;  Laterality: N/A;  Times 2 using left internal mammary artery and endoscopically harvested right saphenous vein.  . Intraoperative transesophageal echocardiogram N/A 05/16/2013    Procedure: INTRAOPERATIVE TRANSESOPHAGEAL ECHOCARDIOGRAM;  Surgeon: [name], MD;  Location: [place];  Service: Open Heart Surgery;  Laterality: N/A;  . Left heart  catheterization with coronary angiogram N/A 05/12/2013    Procedure: LEFT HEART CATHETERIZATION WITH CORONARY ANGIOGRAM;  Surgeon: [name], MD;  Location: [facility] CATH LAB;  Service: Cardiovascular;  Laterality: N/A;    Patient Active Problem List   Diagnosis Date Noted  . PVC's (premature ventricular contractions) 06/21/2014  . Chronic kidney disease (CKD), stage III (moderate) 06/21/2014  . Tricuspid regurgitation 05/18/2014  . Pulmonary hypertension 05/18/2014  . Chronic systolic CHF (congestive heart failure) 05/18/2014  . H/O amiodarone therapy 07/13/2013  . Cardiomyopathy, ischemic 07/13/2013  . Abnormal chest x-ray 05/25/2013  . CAD (coronary artery disease)   . Hx of CABG   . Mitral regurgitation   . Ejection fraction   . Atrial fibrillation   . Diabetes mellitus 05/11/2013  . Dyslipidemia 05/11/2013  . HTN (hypertension) 05/11/2013  . Rheumatoid arthritis 05/11/2013      Current Outpatient Prescriptions  Medication Sig Dispense Refill  . acetaminophen (TYLENOL) 325 MG tablet Take 325-650 mg by mouth every 6 (six) hours as needed.    [name] aspirin EC 325 MG EC tablet Take 1 tablet (325 mg total) by mouth daily. 30 tablet 0  . CALCIUM-VITAMIN D PO Take 1 tablet by mouth daily.    . carvedilol (COREG) 6.25 MG tablet Take 1 tablet (6.25 mg total) by mouth 2 (two) times daily. 180 tablet 3  . cycloSPORINE (SANDIMMUNE) 100 MG capsule Take 1 capsule by mouth daily.    [name]  folic acid (FOLVITE) 1 MG tablet Take 1 mg by mouth daily.    . furosemide (LASIX) 20 MG tablet Take 1 tablet (20 mg total) by mouth daily. 90 tablet 1  . glimepiride (AMARYL) 4 MG tablet Take 1 tablet by mouth daily.    [name] lisinopril (PRINIVIL,ZESTRIL) 20 MG tablet Take 1 tablet (20 mg total) by mouth daily. 30 tablet 3  . methotrexate 2.5 MG tablet Take 2.5 mg by mouth daily.    . rosuvastatin (CRESTOR) 5 MG tablet Take 1 tablet (5 mg total) by mouth daily. 30 tablet 6   No current facility-administered  medications for this visit.    Allergies:   Review of patient's allergies indicates no known allergies.    Social History:  The patient  reports that he quit smoking about 42 years ago. His smoking use included Cigarettes. He has a 20 pack-year smoking history. He has never used smokeless tobacco. He reports that he does not drink alcohol or use illicit drugs.   Family History:  The patient's  family history includes Coronary artery disease in his brother.    ROS:  Please see the history of present illness.     Patient denies fever, chills, headache, sweats, rash, change in vision, change in hearing, chest pain, cough, nausea or vomiting, urinary symptoms. All other systems are reviewed and are negative.   PHYSICAL EXAM: VS:  BP 92/60 mmHg  Pulse 34  Ht 5\' 9"  (1.753 m)  Wt 195 lb (88.451 kg)  BMI 28.78 kg/m2  SpO2 96% , Patient is oriented to person time and place. Affect is normal. He is here with his wife. He has significant deforming rheumatoid arthritis. Head is atraumatic. Sclera and conjunctiva are normal. There is no jugular venous distention. Lungs are clear. Respiratory effort is nonlabored. Cardiac exam reveals S1 and S2. Abdomen is soft. There is no peripheral edema. There are no musculoskeletal deformities. There are no skin rashes. EKG is done today and reviewed by me. There are nonspecific ST-T wave changes. There is sinus rhythm with a rate of 65 at the time of the EKG. At the time of my exam he may have bigeminy. This may have led to the initial measurement of a pulse rate of 34 before the EKG was done. Neurologic is grossly intact.  EKG:   EKG is done today and reviewed by me. At the time of the EKG there is sinus rhythm with a rate of 65.  Recent Labs: No results found for requested labs within last 365 days.    Lipid Panel    Component Value Date/Time   CHOL 119 05/12/2013 0043   TRIG 131 05/12/2013 0043   HDL 15* 05/12/2013 0043   CHOLHDL 7.9 05/12/2013 0043    VLDL 26 05/12/2013 0043   LDLCALC 78 05/12/2013 0043      Wt Readings from Last 3 Encounters:  07/06/14 195 lb (88.451 kg)  06/21/14 194 lb (87.998 kg)  05/18/14 193 lb 12.8 oz (87.907 kg)      Current medicines are reviewed  The patient and his wife understand his medications.     ASSESSMENT AND PLAN:

## 2014-07-17 ENCOUNTER — Encounter: Payer: Self-pay | Admitting: Cardiology

## 2014-07-17 ENCOUNTER — Ambulatory Visit (INDEPENDENT_AMBULATORY_CARE_PROVIDER_SITE_OTHER): Payer: Medicare Other | Admitting: Cardiology

## 2014-07-17 VITALS — BP 126/82 | HR 76 | Ht 69.0 in | Wt 192.0 lb

## 2014-07-17 DIAGNOSIS — I255 Ischemic cardiomyopathy: Secondary | ICD-10-CM

## 2014-07-17 DIAGNOSIS — R040 Epistaxis: Secondary | ICD-10-CM | POA: Insufficient documentation

## 2014-07-17 MED ORDER — ASPIRIN EC 81 MG PO TBEC: 81.0000 mg | DELAYED_RELEASE_TABLET | Freq: Every day | ORAL | Status: DC

## 2014-07-17 MED ORDER — CARVEDILOL 12.5 MG PO TABS: 12.5000 mg | ORAL_TABLET | Freq: Two times a day (BID) | ORAL | Status: DC

## 2014-07-17 NOTE — Assessment & Plan Note (Signed)
He has had recent nosebleeds. I will lower his aspirin to 81 mg. I'm encouraging her to use some Vaseline carefully in his nostril to keep it moisturized

## 2014-07-17 NOTE — Progress Notes (Signed)
Cardiology Office Note   Date:  07/17/2014   ID:  Francis Wilkinson, DOB [birth year], MRN [id number]  PCP:  [name], MD  Cardiologist:  Dola Argyle, MD   Chief Complaint  Patient presents with  . Appointment    Follow-up coronary artery disease      History of Present Illness: Francis Wilkinson is a 69 y.o. male who presents today to follow-up coronary disease and cardiomyopathy. We have been adjusting his medications very carefully. He is doing well. I'm trying to be sure that he is as stable as possible for a family trip coming up in July, 2016. I had not made a change at his last visit. He mentions that he has had an intermittent nosebleed. He is on full dose aspirin at this time.    Past Medical History  Diagnosis Date  . HTN (hypertension)   . Dyslipidemia   . 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 Diabetes mellitus, type II   . GERD (gastroesophageal reflux disease)   . Rheumatoid arthritis   . CAD (coronary artery disease)   . Hx of CABG     February, 2015  . Mitral regurgitation   . Ejection fraction   . Atrial fibrillation     Past Surgical History  Procedure Laterality Date  . Inguinal hernia repair Left ~ 1970  . Coronary artery bypass graft N/A 05/16/2013    Procedure: CORONARY ARTERY BYPASS GRAFTING (CABG);  Surgeon: [name], MD;  Location: [place];  Service: Open Heart Surgery;  Laterality: N/A;  Times 2 using left internal mammary artery and endoscopically harvested right saphenous vein.  . Intraoperative transesophageal echocardiogram N/A 05/16/2013    Procedure: INTRAOPERATIVE TRANSESOPHAGEAL ECHOCARDIOGRAM;  Surgeon: [name], MD;  Location: [place];  Service: Open Heart Surgery;  Laterality: N/A;  . Left heart catheterization with coronary angiogram N/A 05/12/2013    Procedure: LEFT HEART CATHETERIZATION WITH CORONARY ANGIOGRAM;  Surgeon: [name], MD;  Location: [facility] CATH LAB;  Service: Cardiovascular;  Laterality: N/A;     Patient Active Problem List   Diagnosis Date Noted  . PVC's (premature ventricular contractions) 06/21/2014  . Chronic kidney disease (CKD), stage III (moderate) 06/21/2014  . Tricuspid regurgitation 05/18/2014  . Pulmonary hypertension 05/18/2014  . Chronic systolic CHF (congestive heart failure) 05/18/2014  . H/O amiodarone therapy 07/13/2013  . Cardiomyopathy, ischemic 07/13/2013  . Abnormal chest x-ray 05/25/2013  . CAD (coronary artery disease)   . Hx of CABG   . Mitral regurgitation   . Ejection fraction   . Atrial fibrillation   . Diabetes mellitus 05/11/2013  . Dyslipidemia 05/11/2013  . HTN (hypertension) 05/11/2013  . Rheumatoid arthritis 05/11/2013      Current Outpatient Prescriptions  Medication Sig Dispense Refill  . acetaminophen (TYLENOL) 325 MG tablet Take 325-650 mg by mouth every 6 (six) hours as needed.    [name] aspirin EC 325 MG EC tablet Take 1 tablet (325 mg total) by mouth daily. 30 tablet 0  . CALCIUM-VITAMIN D PO Take 1 tablet by mouth daily.    . carvedilol (COREG) 6.25 MG tablet Take 1 tablet (6.25 mg total) by mouth 2 (two) times daily. 180 tablet 3  . cycloSPORINE (SANDIMMUNE) 100 MG capsule Take 1 capsule by mouth daily.    . folic acid (FOLVITE) 1 MG tablet Take 1 mg by mouth daily.    . furosemide (LASIX) 20 MG tablet Take 1 tablet (20 mg  total) by mouth daily. 90 tablet 1  . glimepiride (AMARYL) 4 MG tablet Take 1 tablet by mouth daily.    [name] lisinopril (PRINIVIL,ZESTRIL) 20 MG tablet Take 1 tablet (20 mg total) by mouth daily. 30 tablet 3  . methotrexate 2.5 MG tablet Take 2.5 mg by mouth daily.    . rosuvastatin (CRESTOR) 5 MG tablet Take 1 tablet (5 mg total) by mouth daily. 30 tablet 6   No current facility-administered medications for this visit.    Allergies:   Review of patient's allergies indicates no known allergies.    Social History:  The patient  reports that he quit smoking about 42 years ago. His smoking use included  Cigarettes. He started smoking about 52 years ago. He has a 20 pack-year smoking history. He has never used smokeless tobacco. He reports that he does not drink alcohol or use illicit drugs.   Family History:  The patient's family history includes Coronary artery disease in his brother.    ROS:  Please see the history of present illness.     Patient denies fever, chills, headache, sweats, rash, change in vision, change in hearing, chest pain, cough, nausea or vomiting, urinary symptoms. All other systems are reviewed and are negative.   PHYSICAL EXAM: VS:  BP 126/82 mmHg  Pulse 76  Ht 5\' 9"  (1.753 m)  Wt 192 lb (87.091 kg)  BMI 28.34 kg/m2 , Patient looks good today. He is oriented to person time and place. Affect is normal. Head is atraumatic. Sclera and conjunctiva are normal. There is no jugulovenous distention. Lungs are clear. Respiratory effort is not labored. Cardiac exam reveals an S1 and S2. The abdomen is soft. There is no peripheral edema. There are no musculoskeletal deformities. There are no skin rashes.  EKG:   EKG is not done today.   Recent Labs: No results found for requested labs within last 365 days.    Lipid Panel    Component Value Date/Time   CHOL 119 05/12/2013 0043   TRIG 131 05/12/2013 0043   HDL 15* 05/12/2013 0043   CHOLHDL 7.9 05/12/2013 0043   VLDL 26 05/12/2013 0043   LDLCALC 78 05/12/2013 0043      Wt Readings from Last 3 Encounters:  07/17/14 192 lb (87.091 kg)  07/06/14 195 lb (88.451 kg)  06/21/14 194 lb (87.998 kg)      Current medicines are reviewed  The patient understands his medications.     ASSESSMENT AND PLAN:

## 2014-07-17 NOTE — Patient Instructions (Signed)
Your physician has recommended you make the following change in your medication:  Decrease aspirin to 81 mg daily. Increase carvedilol to 12.5 mg twice daily. You may take (2) of your 6.25 mg tablets twice daily until they are finished. Continue all other medications the same. Please carefully and gently apply vaseline inside of both nostils with a Q-tip to help with your nosebleeds. Your physician recommends that you schedule a follow-up appointment on Jul 28, 2014 with Dr. Ron Parker.

## 2014-07-17 NOTE — Assessment & Plan Note (Signed)
I will be titrating his carvedilol up to 12.5 twice a day today. I will then see him back in one week for follow-up.

## 2014-07-28 ENCOUNTER — Encounter: Payer: Self-pay | Admitting: Cardiology

## 2014-07-28 ENCOUNTER — Ambulatory Visit (INDEPENDENT_AMBULATORY_CARE_PROVIDER_SITE_OTHER): Payer: Medicare Other | Admitting: Cardiology

## 2014-07-28 VITALS — BP 107/71 | HR 73 | Ht 69.0 in | Wt 195.4 lb

## 2014-07-28 DIAGNOSIS — I255 Ischemic cardiomyopathy: Secondary | ICD-10-CM

## 2014-07-28 DIAGNOSIS — I2510 Atherosclerotic heart disease of native coronary artery without angina pectoris: Secondary | ICD-10-CM

## 2014-07-28 DIAGNOSIS — I5022 Chronic systolic (congestive) heart failure: Secondary | ICD-10-CM

## 2014-07-28 MED ORDER — CARVEDILOL 25 MG PO TABS: 25.0000 mg | ORAL_TABLET | Freq: Two times a day (BID) | ORAL | Status: DC

## 2014-07-28 NOTE — Assessment & Plan Note (Signed)
His volume status is stable. No change in therapy. 

## 2014-07-28 NOTE — Progress Notes (Signed)
Cardiology Office Note   Date:  07/28/2014   ID:  Francis Wilkinson, DOB 10/04/1945, MRN [id number]  PCP:  [name], MD  Cardiologist:  Dola Argyle, MD   Chief Complaint  Patient presents with  . Appointment    Follow-up coronary disease      History of Present Illness: Francis Wilkinson is a 69 y.o. male who presents today to follow-up coronary disease and cardiomyopathy. When I saw him last I titrated his carvedilol to a higher dose. He is tolerating this well. His heart rate remains around 75 at rest in the office here today. He's feeling well.    Past Medical History  Diagnosis Date  . HTN (hypertension)   . Dyslipidemia   . 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 Diabetes mellitus, type II   . GERD (gastroesophageal reflux disease)   . Rheumatoid arthritis   . CAD (coronary artery disease)   . Hx of CABG     February, 2015  . Mitral regurgitation   . Ejection fraction   . Atrial fibrillation     Past Surgical History  Procedure Laterality Date  . Inguinal hernia repair Left ~ 1970  . Coronary artery bypass graft N/A 05/16/2013    Procedure: CORONARY ARTERY BYPASS GRAFTING (CABG);  Surgeon: [name], MD;  Location: [place];  Service: Open Heart Surgery;  Laterality: N/A;  Times 2 using left internal mammary artery and endoscopically harvested right saphenous vein.  . Intraoperative transesophageal echocardiogram N/A 05/16/2013    Procedure: INTRAOPERATIVE TRANSESOPHAGEAL ECHOCARDIOGRAM;  Surgeon: [name], MD;  Location: [place];  Service: Open Heart Surgery;  Laterality: N/A;  . Left heart catheterization with coronary angiogram N/A 05/12/2013    Procedure: LEFT HEART CATHETERIZATION WITH CORONARY ANGIOGRAM;  Surgeon: [name], MD;  Location: [facility] CATH LAB;  Service: Cardiovascular;  Laterality: N/A;    Patient Active Problem List   Diagnosis Date Noted  . Frequent nosebleeds 07/17/2014  . PVC's (premature ventricular contractions)  06/21/2014  . Chronic kidney disease (CKD), stage III (moderate) 06/21/2014  . Tricuspid regurgitation 05/18/2014  . Pulmonary hypertension 05/18/2014  . Chronic systolic CHF (congestive heart failure) 05/18/2014  . H/O amiodarone therapy 07/13/2013  . Cardiomyopathy, ischemic 07/13/2013  . Abnormal chest x-ray 05/25/2013  . CAD (coronary artery disease)   . Hx of CABG   . Mitral regurgitation   . Ejection fraction   . Atrial fibrillation   . Diabetes mellitus 05/11/2013  . Dyslipidemia 05/11/2013  . HTN (hypertension) 05/11/2013  . Rheumatoid arthritis 05/11/2013      Current Outpatient Prescriptions  Medication Sig Dispense Refill  . acetaminophen (TYLENOL) 325 MG tablet Take 325-650 mg by mouth every 6 (six) hours as needed.    [name] aspirin EC 81 MG tablet Take 1 tablet (81 mg total) by mouth daily. 90 tablet 3  . CALCIUM-VITAMIN D PO Take 1 tablet by mouth daily.    . carvedilol (COREG) 12.5 MG tablet Take 1 tablet (12.5 mg total) by mouth 2 (two) times daily. 180 tablet 3  . cycloSPORINE (SANDIMMUNE) 100 MG capsule Take 1 capsule by mouth daily.    . folic acid (FOLVITE) 1 MG tablet Take 1 mg by mouth daily.    . furosemide (LASIX) 20 MG tablet Take 1 tablet (20 mg total) by mouth daily. 90 tablet 1  . glimepiride (AMARYL) 4 MG tablet Take 1 tablet by mouth daily.    [name]  lisinopril (PRINIVIL,ZESTRIL) 20 MG tablet Take 1 tablet (20 mg total) by mouth daily. 30 tablet 3  . methotrexate 2.5 MG tablet Take 2.5 mg by mouth daily.    . rosuvastatin (CRESTOR) 5 MG tablet Take 1 tablet (5 mg total) by mouth daily. 30 tablet 6   No current facility-administered medications for this visit.    Allergies:   Review of patient's allergies indicates no known allergies.    Social History:  The patient  reports that he quit smoking about 42 years ago. His smoking use included Cigarettes. He started smoking about 52 years ago. He has a 20 pack-year smoking history. He has never used smokeless  tobacco. He reports that he does not drink alcohol or use illicit drugs.   Family History:  The patient's family history includes Coronary artery disease in his brother.    ROS:  Please see the history of present illness.     Patient denies fever, chills, headache, sweats, rash, change in vision, change in hearing, chest pain, cough, nausea or vomiting, urinary symptoms. All other systems are reviewed and are negative.   PHYSICAL EXAM: VS:  BP 107/71 mmHg  Pulse 73  Ht 5\' 9"  (1.753 m)  Wt 195 lb 6.4 oz (88.633 kg)  BMI 28.84 kg/m2  SpO2 94% , Patient looks good today. He has deforming rheumatoid arthritis. He is oriented to person time and place. Affect is normal. Head is atraumatic. Sclera and conjunctiva are normal. There is no jugular venous distention. Lungs are clear. Respiratory effort is nonlabored. Cardiac exam reveals S1 and S2. Abdomen is soft. There is no peripheral edema. There are no skin rashes.  EKG:   EKG is not done today.   Recent Labs: No results found for requested labs within last 365 days.    Lipid Panel    Component Value Date/Time   CHOL 119 05/12/2013 0043   TRIG 131 05/12/2013 0043   HDL 15* 05/12/2013 0043   CHOLHDL 7.9 05/12/2013 0043   VLDL 26 05/12/2013 0043   LDLCALC 78 05/12/2013 0043      Wt Readings from Last 3 Encounters:  07/28/14 195 lb 6.4 oz (88.633 kg)  07/17/14 192 lb (87.091 kg)  07/06/14 195 lb (88.451 kg)      Current medicines are reviewed  The patient understands his medications.     ASSESSMENT AND PLAN:

## 2014-07-28 NOTE — Assessment & Plan Note (Signed)
I'm continuing to titrate his medications. We will increase carvedilol from 12.5 twice a day to 25 twice a day today.

## 2014-07-28 NOTE — Assessment & Plan Note (Signed)
Coronary disease is stable. No change in therapy. 

## 2014-07-28 NOTE — Patient Instructions (Signed)
Your physician has recommended you make the following change in your medication:  Increase carvedilol to 25 mg twice daily. You may take (2) of your 12.5 mg twice daily until they are finished. Continue all other medications the same. Your physician recommends that you schedule a follow-up appointment in: August 31, 2014 with Dr. Ron Parker.

## 2014-08-31 ENCOUNTER — Ambulatory Visit (INDEPENDENT_AMBULATORY_CARE_PROVIDER_SITE_OTHER): Payer: Medicare Other | Admitting: Cardiology

## 2014-08-31 ENCOUNTER — Encounter: Payer: Self-pay | Admitting: Cardiology

## 2014-08-31 VITALS — BP 100/63 | HR 64 | Ht 69.0 in | Wt 193.0 lb

## 2014-08-31 DIAGNOSIS — I25810 Atherosclerosis of coronary artery bypass graft(s) without angina pectoris: Secondary | ICD-10-CM

## 2014-08-31 DIAGNOSIS — I5022 Chronic systolic (congestive) heart failure: Secondary | ICD-10-CM | POA: Diagnosis not present

## 2014-08-31 DIAGNOSIS — I255 Ischemic cardiomyopathy: Secondary | ICD-10-CM

## 2014-08-31 NOTE — Assessment & Plan Note (Signed)
As I outlined above I have been carefully titrating his medications. I'll allow him to go on his trip. When he returns, we will decide if we will use spironolactone very carefully. Ultimately he needs a follow-up echo.

## 2014-08-31 NOTE — Patient Instructions (Signed)
Your physician recommends that you continue on your current medications as directed. Please refer to the Current Medication list given to you today. Your physician recommends that you schedule a follow-up appointment in: 11/29/14 with Dr. Ron Parker.

## 2014-08-31 NOTE — Progress Notes (Signed)
Cardiology Office Note   Date:  08/31/2014   ID:  Francis Wilkinson, DOB 1946/02/28, MRN [id number]  PCP:  [name], MD  Cardiologist:  Dola Argyle, MD   Chief Complaint  Patient presents with  . Appointment    Follow-up coronary artery disease and cardiomyopathy      History of Present Illness: Francis Wilkinson is a 69 y.o. male who presents today to follow-up coronary disease and cardiomyopathy. Earlier this year he had been hospitalized in [place] with worsening left ventricular function. Since that time we have adjusted his medications and he is doing very well. He is not having any chest pain or shortness of breath. I have very carefully adjusted his medications. Most recently I increased his carvedilol up to 25 mg twice a day. He is now on a good regimen with carvedilol 25 twice a day lisinopril 20 and furosemide 20. I've considered adding spironolactone today. However he has had some renal dysfunction in the past. He is getting ready to go on a trip with his family. I feel it is most prudent to wait until he returns before carefully trying spironolactone.    Past Medical History  Diagnosis Date  . HTN (hypertension)   . Dyslipidemia   . 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 Diabetes mellitus, type II   . GERD (gastroesophageal reflux disease)   . Rheumatoid arthritis   . CAD (coronary artery disease)   . Hx of CABG     February, 2015  . Mitral regurgitation   . Ejection fraction   . Atrial fibrillation     Past Surgical History  Procedure Laterality Date  . Inguinal hernia repair Left ~ 1970  . Coronary artery bypass graft N/A 05/16/2013    Procedure: CORONARY ARTERY BYPASS GRAFTING (CABG);  Surgeon: [name], MD;  Location: [place];  Service: Open Heart Surgery;  Laterality: N/A;  Times 2 using left internal mammary artery and endoscopically harvested right saphenous vein.  . Intraoperative transesophageal echocardiogram N/A 05/16/2013   Procedure: INTRAOPERATIVE TRANSESOPHAGEAL ECHOCARDIOGRAM;  Surgeon: [name], MD;  Location: [place];  Service: Open Heart Surgery;  Laterality: N/A;  . Left heart catheterization with coronary angiogram N/A 05/12/2013    Procedure: LEFT HEART CATHETERIZATION WITH CORONARY ANGIOGRAM;  Surgeon: [name], MD;  Location: [facility] CATH LAB;  Service: Cardiovascular;  Laterality: N/A;    Patient Active Problem List   Diagnosis Date Noted  . Frequent nosebleeds 07/17/2014  . PVC's (premature ventricular contractions) 06/21/2014  . Chronic kidney disease (CKD), stage III (moderate) 06/21/2014  . Tricuspid regurgitation 05/18/2014  . Pulmonary hypertension 05/18/2014  . Chronic systolic CHF (congestive heart failure) 05/18/2014  . H/O amiodarone therapy 07/13/2013  . Cardiomyopathy, ischemic 07/13/2013  . Abnormal chest x-ray 05/25/2013  . CAD (coronary artery disease)   . Hx of CABG   . Mitral regurgitation   . Ejection fraction   . Atrial fibrillation   . Diabetes mellitus 05/11/2013  . Dyslipidemia 05/11/2013  . HTN (hypertension) 05/11/2013  . Rheumatoid arthritis 05/11/2013      Current Outpatient Prescriptions  Medication Sig Dispense Refill  . acetaminophen (TYLENOL) 325 MG tablet Take 325-650 mg by mouth every 6 (six) hours as needed.    [name] aspirin EC 81 MG tablet Take 1 tablet (81 mg total) by mouth daily. 90 tablet 3  . CALCIUM-VITAMIN D PO Take 1 tablet by mouth daily.    . carvedilol (  COREG) 25 MG tablet Take 1 tablet (25 mg total) by mouth 2 (two) times daily. 180 tablet 3  . cycloSPORINE (SANDIMMUNE) 100 MG capsule Take 1 capsule by mouth daily.    . folic acid (FOLVITE) 1 MG tablet Take 1 mg by mouth daily.    . furosemide (LASIX) 20 MG tablet Take 1 tablet (20 mg total) by mouth daily. 90 tablet 1  . glimepiride (AMARYL) 4 MG tablet Take 1 tablet by mouth daily.    [name] lisinopril (PRINIVIL,ZESTRIL) 20 MG tablet Take 1 tablet (20 mg total) by mouth daily. 30 tablet 3  .  methotrexate 2.5 MG tablet Take 2.5 mg by mouth daily.    . rosuvastatin (CRESTOR) 5 MG tablet Take 1 tablet (5 mg total) by mouth daily. 30 tablet 6   No current facility-administered medications for this visit.    Allergies:   Review of patient's allergies indicates no known allergies.    Social History:  The patient  reports that he quit smoking about 42 years ago. His smoking use included Cigarettes. He started smoking about 52 years ago. He has a 20 pack-year smoking history. He has never used smokeless tobacco. He reports that he does not drink alcohol or use illicit drugs.   Family History:  The patient's family history includes Coronary artery disease in his brother.    ROS:  Please see the history of present illness.     Patient denies fever, chills, headache, sweats, rash, change in vision, change in hearing, chest pain, cough, nausea or vomiting, urinary symptoms. All other systems are reviewed and are negative.   PHYSICAL EXAM: VS:  BP 100/63 mmHg  Pulse 64  Ht 5\' 9"  (1.753 m)  Wt 193 lb (87.544 kg)  BMI 28.49 kg/m2  SpO2 94% , He is really looking good. He is oriented to person time and place. Affect is normal. Head is atraumatic. Sclera and conjunctiva are normal. There is no jugulovenous distention. Lungs reveal scattered rhonchi and crackles. I believe that these are chronic and related to his rheumatoid arthritis. Cardiac exam reveals S1 and S2. Abdomen is soft. There is no peripheral edema. He does have changes in his hands of chronic rheumatoid arthritis. There are no skin rashes.  EKG:   EKG is not done today.   Recent Labs: No results found for requested labs within last 365 days.    Lipid Panel    Component Value Date/Time   CHOL 119 05/12/2013 0043   TRIG 131 05/12/2013 0043   HDL 15* 05/12/2013 0043   CHOLHDL 7.9 05/12/2013 0043   VLDL 26 05/12/2013 0043   LDLCALC 78 05/12/2013 0043      Wt Readings from Last 3 Encounters:  08/31/14 193 lb (87.544  kg)  07/28/14 195 lb 6.4 oz (88.633 kg)  07/17/14 192 lb (87.091 kg)      Current medicines are reviewed  The patient understands his medications.     ASSESSMENT AND PLAN:

## 2014-08-31 NOTE — Assessment & Plan Note (Signed)
His volume status is very well controlled. No change in therapy.

## 2014-08-31 NOTE — Assessment & Plan Note (Signed)
Coronary disease is stable. He underwent bypass in 2015. No change in therapy.

## 2014-11-29 ENCOUNTER — Encounter: Payer: Self-pay | Admitting: Cardiology

## 2014-11-29 ENCOUNTER — Ambulatory Visit (INDEPENDENT_AMBULATORY_CARE_PROVIDER_SITE_OTHER): Payer: Medicare Other | Admitting: Cardiology

## 2014-11-29 VITALS — BP 103/69 | HR 66 | Ht 69.0 in | Wt 189.8 lb

## 2014-11-29 DIAGNOSIS — I5022 Chronic systolic (congestive) heart failure: Secondary | ICD-10-CM | POA: Diagnosis not present

## 2014-11-29 DIAGNOSIS — I2510 Atherosclerotic heart disease of native coronary artery without angina pectoris: Secondary | ICD-10-CM | POA: Diagnosis not present

## 2014-11-29 DIAGNOSIS — I255 Ischemic cardiomyopathy: Secondary | ICD-10-CM | POA: Diagnosis not present

## 2014-11-29 DIAGNOSIS — R0989 Other specified symptoms and signs involving the circulatory and respiratory systems: Secondary | ICD-10-CM

## 2014-11-29 DIAGNOSIS — I340 Nonrheumatic mitral (valve) insufficiency: Secondary | ICD-10-CM | POA: Diagnosis not present

## 2014-11-29 DIAGNOSIS — R9430 Abnormal result of cardiovascular function study, unspecified: Secondary | ICD-10-CM

## 2014-11-29 NOTE — Assessment & Plan Note (Signed)
The patient did have moderate to severe mitral regurgitation at the time of his echo in February, 2016, in [place]. I suspect that this is better now. This will be reassessed when he has a follow-up echo in the future.

## 2014-11-29 NOTE — Assessment & Plan Note (Signed)
His ejection fraction had been in the 45% range around the time of his bypass surgery. Echo in [place] in February, 2016 revealed an ejection fraction of 25-30%. Since that time I have titrated his meds. He is feeling and doing very well. I have not tried spironolactone. He does have renal insufficiency. I decided to make no changes at this time. When he meets the new cardiologist in our team in 3-4 months, decisions can be made as to whether or not it seems appropriate to titrate his meds any further. Also will be appropriate to recheck his left ventricular function by echo at that time.

## 2014-11-29 NOTE — Patient Instructions (Signed)
Your physician recommends that you continue on your current medications as directed. Please refer to the Current Medication list given to you today. Your physician recommends that you schedule a follow-up appointment in: 3-4 months. You will receive a reminder letter in the mail in about 1-2 months reminding you to call and schedule your appointment. If you don't receive this letter, please contact our office.

## 2014-11-29 NOTE — Assessment & Plan Note (Signed)
Coronary disease is stable. He underwent bypass surgery in February, 2015. No further workup.

## 2014-11-29 NOTE — Assessment & Plan Note (Signed)
His volume status is stable. No change in therapy. 

## 2014-11-29 NOTE — Progress Notes (Signed)
Cardiology Office Note   Date:  11/29/2014   ID:  Francis Wilkinson, DOB 02-Mar-1946, MRN [id number]  PCP:  [name], MD  Cardiologist:  Dola Argyle, MD   Chief Complaint  Patient presents with  . Appointment    Follow-up coronary artery disease      History of Present Illness: Francis Wilkinson is a 69 y.o. male who presents today to follow-up coronary disease and cardiomyopathy. Early in 04/17/1958 and been hospitalized in [place] with worsening left ventricular function. I very carefully titrated his medications up. He has done very well. I've considered spironolactone. However he has some renal dysfunction and I have not tried this medication yet. I was aware that he was planning a [place] I was concerned about this but ultimately he was able to make the trip very successfully. He is now back and doing well. He is not having any significant chest pain or shortness of breath.    Past Medical History  Diagnosis Date  . HTN (hypertension)   . Dyslipidemia   . 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 Diabetes mellitus, type II   . GERD (gastroesophageal reflux disease)   . Rheumatoid arthritis   . CAD (coronary artery disease)   . Hx of CABG     February, 2015  . Mitral regurgitation   . Ejection fraction   . Atrial fibrillation     Past Surgical History  Procedure Laterality Date  . Inguinal hernia repair Left ~ 1970  . Coronary artery bypass graft N/A 05/16/2013    Procedure: CORONARY ARTERY BYPASS GRAFTING (CABG);  Surgeon: [name], MD;  Location: [place];  Service: Open Heart Surgery;  Laterality: N/A;  Times 2 using left internal mammary artery and endoscopically harvested right saphenous vein.  . Intraoperative transesophageal echocardiogram N/A 05/16/2013    Procedure: INTRAOPERATIVE TRANSESOPHAGEAL ECHOCARDIOGRAM;  Surgeon: [name], MD;  Location: [place];  Service:  Open Heart Surgery;  Laterality: N/A;  . Left heart catheterization with coronary angiogram N/A 05/12/2013    Procedure: LEFT HEART CATHETERIZATION WITH CORONARY ANGIOGRAM;  Surgeon: [name], MD;  Location: [facility] CATH LAB;  Service: Cardiovascular;  Laterality: N/A;    Patient Active Problem List   Diagnosis Date Noted  . Frequent nosebleeds 07/17/2014  . PVC's (premature ventricular contractions) 06/21/2014  . Chronic kidney disease (CKD), stage III (moderate) 06/21/2014  . Tricuspid regurgitation 05/18/2014  . Pulmonary hypertension 05/18/2014  . Chronic systolic CHF (congestive heart failure) 05/18/2014  . H/O amiodarone therapy 07/13/2013  . Cardiomyopathy, ischemic 07/13/2013  . Abnormal chest x-ray 05/25/2013  . CAD (coronary artery disease)   . Hx of CABG   . Mitral regurgitation   . Ejection fraction   . Atrial fibrillation   . Diabetes mellitus 05/11/2013  . Dyslipidemia 05/11/2013  . HTN (hypertension) 05/11/2013  . Rheumatoid arthritis 05/11/2013      Current Outpatient Prescriptions  Medication Sig Dispense Refill  . acetaminophen (TYLENOL) 325 MG tablet Take 325-650 mg by mouth every 6 (six) hours as needed.    [name] aspirin EC 81 MG tablet Take 1 tablet (81 mg total) by mouth daily. 90 tablet 3  . CALCIUM-VITAMIN D PO Take 1 tablet by mouth daily.    . carvedilol (COREG) 25 MG tablet Take 1 tablet (25 mg total) by mouth 2 (two) times daily. 180 tablet 3  .  cycloSPORINE (SANDIMMUNE) 100 MG capsule Take 1 capsule by mouth daily.    . folic acid (FOLVITE) 1 MG tablet Take 1 mg by mouth daily.    . furosemide (LASIX) 20 MG tablet Take 1 tablet (20 mg total) by mouth daily. 90 tablet 1  . glimepiride (AMARYL) 4 MG tablet Take 1 tablet by mouth daily.    [name] lisinopril (PRINIVIL,ZESTRIL) 20 MG tablet Take 1 tablet (20 mg total) by mouth daily. 30 tablet 3  . methotrexate 2.5 MG tablet Take 2.5 mg by mouth daily.    . rosuvastatin (CRESTOR) 5 MG tablet Take 1 tablet (5 mg  total) by mouth daily. 30 tablet 6   No current facility-administered medications for this visit.    Allergies:   Review of patient's allergies indicates no known allergies.    Social History:  The patient  reports that he quit smoking about 43 years ago. His smoking use included Cigarettes. He started smoking about 53 years ago. He has a 20 pack-year smoking history. He has never used smokeless tobacco. He reports that he does not drink alcohol or use illicit drugs.   Family History:  The patient's family history includes Coronary artery disease in his brother.    ROS:  Please see the history of present illness.     Patient denies fever, chills, headache, sweats, rash, change in vision, change in hearing, chest pain, cough, nausea or vomiting, urinary symptoms. All other systems are reviewed and are negative.   PHYSICAL EXAM: VS:  BP 103/69 mmHg  Pulse 66  Ht 5\' 9"  (1.753 m)  Wt 189 lb 12.8 oz (86.093 kg)  BMI 28.02 kg/m2  SpO2 94% , Patient is here with his family member. He is quite stable. He is oriented to person time and place. Affect is normal. Head is atraumatic. Sclera and conjunctiva are normal. There is no jugular venous distention. Lungs are clear. Respiratory effort is not labored. Cardiac exam reveals an S1 and S2. The abdomen is soft. There is no peripheral edema. He has deforming rheumatoid arthritis changes in his hands. There are no skin rashes.   EKG:   EKG is not done today.   Recent Labs: No results found for requested labs within last 365 days.    Lipid Panel    Component Value Date/Time   CHOL 119 05/12/2013 0043   TRIG 131 05/12/2013 0043   HDL 15* 05/12/2013 0043   CHOLHDL 7.9 05/12/2013 0043   VLDL 26 05/12/2013 0043   LDLCALC 78 05/12/2013 0043      Wt Readings from Last 3 Encounters:  11/29/14 189 lb 12.8 oz (86.093 kg)  08/31/14 193 lb (87.544 kg)  07/28/14 195 lb 6.4 oz (88.633 kg)      Current medicines are reviewed  The patient  understands his medications.     ASSESSMENT AND PLAN:

## 2014-12-18 ENCOUNTER — Other Ambulatory Visit: Payer: Self-pay | Admitting: Cardiology

## 2015-02-15 ENCOUNTER — Other Ambulatory Visit: Payer: Self-pay | Admitting: Cardiology

## 2015-03-29 ENCOUNTER — Ambulatory Visit: Payer: Medicare Other | Admitting: Cardiology

## 2015-04-25 ENCOUNTER — Ambulatory Visit (INDEPENDENT_AMBULATORY_CARE_PROVIDER_SITE_OTHER): Payer: Medicare Other | Admitting: Cardiology

## 2015-04-25 ENCOUNTER — Encounter: Payer: Self-pay | Admitting: Cardiology

## 2015-04-25 VITALS — BP 98/62 | HR 63 | Ht 69.0 in | Wt 192.0 lb

## 2015-04-25 DIAGNOSIS — E785 Hyperlipidemia, unspecified: Secondary | ICD-10-CM

## 2015-04-25 DIAGNOSIS — I071 Rheumatic tricuspid insufficiency: Secondary | ICD-10-CM

## 2015-04-25 DIAGNOSIS — I340 Nonrheumatic mitral (valve) insufficiency: Secondary | ICD-10-CM

## 2015-04-25 DIAGNOSIS — I9789 Other postprocedural complications and disorders of the circulatory system, not elsewhere classified: Secondary | ICD-10-CM

## 2015-04-25 DIAGNOSIS — I2510 Atherosclerotic heart disease of native coronary artery without angina pectoris: Secondary | ICD-10-CM

## 2015-04-25 DIAGNOSIS — I4891 Unspecified atrial fibrillation: Secondary | ICD-10-CM

## 2015-04-25 DIAGNOSIS — I255 Ischemic cardiomyopathy: Secondary | ICD-10-CM

## 2015-04-25 DIAGNOSIS — I10 Essential (primary) hypertension: Secondary | ICD-10-CM

## 2015-04-25 NOTE — Progress Notes (Signed)
Cardiology Office Note  Date: 04/25/2015   ID: HURLEY BAZE, DOB 08-03-1945, MRN [id number]  PCP: [name], MD  Primary Cardiologist: [name], MD   Chief Complaint  Patient presents with  . Coronary Artery Disease  . Cardiomyopathy    History of Present Illness: Francis Wilkinson is a 70 y.o. male former patient of Dr. Ron Parker, now establishing follow-up with me. I reviewed extensive records and updated his chart. He last saw Dr. Ron Parker in September 2016. He presents today with his wife. He tells me that overall he has been doing well with typical ADLs. He reports NYHA class II dyspnea, worse if he walks a long distance or pushes himself. He does not endorse any angina symptoms and has not needed any nitroglycerin. He has had no sudden dizziness or syncope, denies any palpitations.  Last cardiac structural evaluation was in February 2016 by echocardiogram in [place]. I reviewed these records and summarized the echocardiogram below. LVEF was reduced at that time compared with previous assessment, and his valvular heart disease was also more significant.  We reviewed his current medications which he reports tolerating. Cardiac regimen includes aspirin, Coreg, Lasix, lisinopril, and Crestor. He has not been placed on Aldactone with prior concerns about renal insufficiency based on reviewing Dr. Ron Parker notes. The pressure and heart rate are very well controlled today. He does not report dizziness with current blood pressure  Lipids have been well controlled, he follows with Dr. Hermelinda Medicus in [place].  Past Medical History  Diagnosis Date  . Essential hypertension   . Hyperlipidemia   . Skin cancer   . Diabetes mellitus, type II ([place])   . GERD (gastroesophageal reflux disease)   . Rheumatoid arthritis ([place])   . CAD (coronary artery disease)     Multivessel status post CABG February 2015   . Cardiomyopathy ([place])     LVEF 25-30% [name], February 2016   .  Postoperative atrial fibrillation ([place])   . Mitral regurgitation     Moderate to severe [name], February 2016   . Tricuspid regurgitation     Moderate to severe [name], February 2016   . Secondary pulmonary hypertension (HCC)     PASP 69 mmHg  [name], February 2016   . Aortic stenosis     Mild [name], February 2016     Past Surgical History  Procedure Laterality Date  . Inguinal hernia repair Left ~ 1970  . Coronary artery bypass graft N/A 05/16/2013    Procedure: CORONARY ARTERY BYPASS GRAFTING (CABG);  Surgeon: [name], MD;  Location: [facility];  Service: Open Heart Surgery;  Laterality: N/A;  Times 2 using left internal mammary artery and endoscopically harvested right saphenous vein.  . Intraoperative transesophageal echocardiogram N/A 05/16/2013    Procedure: INTRAOPERATIVE TRANSESOPHAGEAL ECHOCARDIOGRAM;  Surgeon: [name], MD;  Location: [facility];  Service: Open Heart Surgery;  Laterality: N/A;  . Left heart catheterization with coronary angiogram N/A 05/12/2013    Procedure: LEFT HEART CATHETERIZATION WITH CORONARY ANGIOGRAM;  Surgeon: [name], MD;  Location[facility] C[facility] LAB;  Service: Cardiovascular;  Laterality: N/A;    Current Outpatient Prescriptions  Medication Sig Dispense Refill  . acetaminophen (TYLENOL) 325 MG tablet Take 325-650 mg by mouth every 6 (six) hours as needed.    [name] aspirin EC 81 MG tablet Take 1 tablet (81 mg total) by mouth daily. 90 tablet 3  . CALCIUM-VITAMIN D PO Take 1 tablet by mouth daily.    [name]  carvedilol (COREG) 25 MG tablet Take 1 tablet (25 mg total) by mouth 2 (two) times daily. 180 tablet 3  . cycloSPORINE (SANDIMMUNE) 100 MG capsule Take 1 capsule by mouth daily.    . folic acid (FOLVITE) 1 MG tablet Take 1 mg by mouth daily.    . furosemide (LASIX) 20 MG tablet TAKE 1 TABLET (20 MG TOTAL) BY MOUTH DAILY. 90 tablet 1  . lisinopril (PRINIVIL,ZESTRIL) 20 MG tablet TAKE 1 TABLET BY MOUTH EVERY DAY 30 tablet 6    . methotrexate 2.5 MG tablet Take 2.5 mg by mouth daily.    . rosuvastatin (CRESTOR) 5 MG tablet Take 1 tablet (5 mg total) by mouth daily. 30 tablet 6   No current facility-administered medications for this visit.   Allergies:  Review of patient's allergies indicates no known allergies.   Social History: The patient  reports that he quit smoking about 43 years ago. His smoking use included Cigarettes. He started smoking about 53 years ago. He has a 20 pack-year smoking history. He has never used smokeless tobacco. He reports that he does not drink alcohol or use illicit drugs.   ROS:  Please see the history of present illness. Otherwise, complete review of systems is positive for mildly decreased hearing. Has pending cataract surgery. Chronic problems with rheumatoid arthritis pain involving his hands and feet..  All other systems are reviewed and negative.   Physical Exam: VS:  BP 98/62 mmHg  Pulse 63  Ht 5\' 9"  (1.753 m)  Wt 192 lb (87.091 kg)  BMI 28.34 kg/m2  SpO2 95%, BMI Body mass index is 28.34 kg/(m^2).  Wt Readings from Last 3 Encounters:  04/25/15 192 lb (87.091 kg)  11/29/14 189 lb 12.8 oz (86.093 kg)  08/31/14 193 lb (87.544 kg)    General:  Elderly male, appears comfortable at rest. HEENT: Conjunctiva and lids normal, oropharynx clear with moist mucosa. Neck: Supple, no elevated JVP or carotid bruits, no thyromegaly. Lungs: Clear to auscultation, nonlabored breathing at rest. Cardiac: Indistinct PMI with RRR, no S3, 2/6 systolic murmur, no pericardial rub. Abdomen: Soft, nontender, bowel sounds present, no guarding or rebound. Extremities: No pitting edema, distal pulses 2+. Skin: Warm and dry. Musculoskeletal: No kyphosis. Arthritic deformities in hands and feet consistent with rheumatoid arthritis. Neuropsychiatric: Alert and oriented x3, affect grossly appropriate.  ECG: I personally reviewed his tracing from 07/06/2014 which showed normal sinus rhythm with  nonspecific T-wave changes and borderline prolonged QT interval.  Recent Labwork:  November 2016: BUN 31, creatinine 1.4, potassium 4.6, hemoglobin A1c 5.8, AST 19, ALT 11, cholesterol 117, HDL 15, LDL 75, triglycerides 133, hemoglobin 13, platelets 105  Other Studies Reviewed Today:  Echocardiogram 05/03/2014 [facility]): Mild LVH with LVEF 25-30%, mildly reduced RV contraction, mildly dilated left and right atrium, moderate to severe mitral regurgitation, mild aortic stenosis, moderate to severe tricuspid regurgitation with PASP 69 mmHg, no pericardial effusion.  Cardiac catheterization 05/12/2013 (pre-CABG): Coronary dominance: right  Left mainstem: Mild irregularities.  Left anterior descending (LAD): Severely calcified proximally. Diffuse 30% proximal. There is a 95% stenosis in the mid vessel after a small diagonal.   Left circumflex (LCx): 70% proximal. The first OM is occluded with faint collateral flow. The second OM is without significant disease.   Right coronary artery (RCA): The RCA is a large ectatic vessel. It is severely calcified throughout. There is segmental 40% disease in the proximal to mid vessel with a 60-70% lesion at the crux. The PDA has 60-70% disease  proximally. The second PLOM branch is occluded and fills by left to right collaterals.  Left ventriculography: Left ventricular systolic function is abnormal, there is moderate to severe anterior hypokinesis, LVEF is estimated at 40-45%%, there is no significant mitral regurgitation   Final Conclusions:  1. Severe 3 vessel obstructive CAD 2. Moderate LV dysfunction.  Assessment and Plan:  1. Multivessel CAD status post CABG in March 2015. He is doing well in terms of symptom control with no active angina on medical therapy.  2. Presumed ischemic cardiomyopathy, LVEF 25-30% by echocardiogram in [place] last year. He is relatively stable in terms of dyspnea on exertion, reports no major trouble with leg  edema or orthopnea. His weight is up only a few pounds from the last visit. Current medical regimen is reasonable and his blood pressure and heart rate are very well controlled. Plan is to follow-up with an echocardiogram to reassess LVEF. We did touch on the possibility of EP evaluation for prophylactic defibrillator if ejection fraction remains in current range.  3. Valvular heart disease including moderate to severe mitral and tricuspid regurgitation as of assessment last year in [place]. He also had mild aortic stenosis at that time. Fairly soft murmur on examination today. This will be reevaluated with repeat echocardiogram.  4. Hyperlipidemia, continues on Crestor. Recent LDL 75.  5. History of postoperative atrial fibrillation following CABG. No obvious recurrences. He is not anticoagulated.  6. Essential hypertension, blood pressure is very well controlled in the setting of cardiomyopathy.  Current medicines were reviewed with the patient today.   Orders Placed This Encounter  Procedures  . ECHOCARDIOGRAM COMPLETE    Disposition: FU with me in 6 months.   Signed, Satira Sark, MD, [facility] 04/25/2015 8:50 AM    [facility] [address], [place], [place] [address] Phone: [phone]; Fax: [phone]

## 2015-04-25 NOTE — Patient Instructions (Signed)

## 2015-05-02 ENCOUNTER — Ambulatory Visit (INDEPENDENT_AMBULATORY_CARE_PROVIDER_SITE_OTHER): Payer: Medicare Other

## 2015-05-02 ENCOUNTER — Other Ambulatory Visit: Payer: Self-pay

## 2015-05-02 DIAGNOSIS — I340 Nonrheumatic mitral (valve) insufficiency: Secondary | ICD-10-CM

## 2015-05-02 DIAGNOSIS — I255 Ischemic cardiomyopathy: Secondary | ICD-10-CM | POA: Diagnosis not present

## 2015-05-03 ENCOUNTER — Telehealth: Payer: Self-pay | Admitting: *Deleted

## 2015-05-03 DIAGNOSIS — I5022 Chronic systolic (congestive) heart failure: Secondary | ICD-10-CM

## 2015-05-03 DIAGNOSIS — R9430 Abnormal result of cardiovascular function study, unspecified: Secondary | ICD-10-CM

## 2015-05-03 DIAGNOSIS — I255 Ischemic cardiomyopathy: Secondary | ICD-10-CM

## 2015-05-03 NOTE — Telephone Encounter (Signed)
-----  Message from Satira Sark, MD sent at 05/02/2015  2:43 PM EST ----- Reviewed report. Overall no major change in LVE, currently in the range of 30-35%. Mitral regurgitation is only mild to moderate at this time which represents an improvement from the prior study. Fortunately, he has been stable on medical therapy for cardiomyopathy. I met him recently in the clinic, former patient of Dr. Ron Parker. We did discuss the possibility of considering a prophylactic ICD if he did not show substantial improvement in LVEF. We could make a referral for EP consultation if he would like to explore this further, otherwise I can see him back as scheduled.

## 2015-05-03 NOTE — Telephone Encounter (Signed)
Patient informed and agrees to see an EP for consultation of ICD.

## 2015-05-07 ENCOUNTER — Encounter: Payer: Self-pay | Admitting: Internal Medicine

## 2015-05-07 ENCOUNTER — Ambulatory Visit (INDEPENDENT_AMBULATORY_CARE_PROVIDER_SITE_OTHER): Payer: Medicare Other | Admitting: Internal Medicine

## 2015-05-07 VITALS — BP 124/66 | HR 67 | Ht 69.0 in | Wt 192.4 lb

## 2015-05-07 DIAGNOSIS — I255 Ischemic cardiomyopathy: Secondary | ICD-10-CM

## 2015-05-07 DIAGNOSIS — I5022 Chronic systolic (congestive) heart failure: Secondary | ICD-10-CM | POA: Diagnosis not present

## 2015-05-07 DIAGNOSIS — I10 Essential (primary) hypertension: Secondary | ICD-10-CM | POA: Diagnosis not present

## 2015-05-07 NOTE — Progress Notes (Signed)
Electrophysiology Office Note   Date:  05/07/2015   ID:  Francis Wilkinson, Francis Wilkinson 1945/09/04, MRN [id number]  PCP:  [name], MD  Cardiologist:  Dr Domenic Polite (referring) Primary Electrophysiologist: [name], MD    CC: CHF   History of Present Illness: Francis Wilkinson is a 70 y.o. male who presents today for electrophysiology evaluation.   The patient has a h/o CAD s/p CABG in 2015, ischemic CM (EF 30%), and NYHA Class III CHF.  He reports doing reasonably well since his bypass.  He remains active.  He reports SOB with walking <1/2 mile.  Recent echo reveals EF 30%.  He reports compliance with medicines.  Today, he denies symptoms of palpitations, chest pain, shortness of breath, orthopnea, PND, lower extremity edema, claudication, dizziness, presyncope, syncope, bleeding, or neurologic sequela. The patient is tolerating medications without difficulties and is otherwise without complaint today.    Past Medical History  Diagnosis Date  . Essential hypertension   . Hyperlipidemia   . Skin cancer     pt denies  . Diabetes mellitus, type II ([place])     diet controlled  . GERD (gastroesophageal reflux disease)   . Rheumatoid arthritis ([place])   . CAD (coronary artery disease)     Multivessel status post CABG February 2015   . Ischemic cardiomyopathy     LVEF 25-30% [name], February 2016   . Postoperative atrial fibrillation ([place])   . Mitral regurgitation     mild to moderate  . Tricuspid regurgitation     moderate  . Secondary pulmonary hypertension (HCC)     PASP 69 mmHg  [name], February 2016   . Left atrial enlargement    Past Surgical History  Procedure Laterality Date  . Inguinal hernia repair Left ~ 1970  . Coronary artery bypass graft N/A 05/16/2013    Procedure: CORONARY ARTERY BYPASS GRAFTING (CABG);  Surgeon: [name], MD;  Location: [facility];  Service: Open Heart Surgery;  Laterality: N/A;  Times 2 using left internal mammary artery and  endoscopically harvested right saphenous vein.  . Intraoperative transesophageal echocardiogram N/A 05/16/2013    Procedure: INTRAOPERATIVE TRANSESOPHAGEAL ECHOCARDIOGRAM;  Surgeon: [name], MD;  Location: [facility];  Service: Open Heart Surgery;  Laterality: N/A;  . Left heart catheterization with coronary angiogram N/A 05/12/2013    Procedure: LEFT HEART CATHETERIZATION WITH CORONARY ANGIOGRAM;  Surgeon: [name], MD;  Location: [facility] CATH LAB;  Service: Cardiovascular;  Laterality: N/A;     Current Outpatient Prescriptions  Medication Sig Dispense Refill  . acetaminophen (TYLENOL) 325 MG tablet Take 325-650 mg by mouth every 6 (six) hours as needed (pain).     [name] aspirin EC 81 MG tablet Take 1 tablet (81 mg total) by mouth daily. 90 tablet 3  . CALCIUM-VITAMIN D PO Take 1 tablet by mouth daily.    . carvedilol (COREG) 25 MG tablet Take 1 tablet (25 mg total) by mouth 2 (two) times daily. 180 tablet 3  . cycloSPORINE (SANDIMMUNE) 100 MG capsule Take 1 capsule by mouth daily.    . folic acid (FOLVITE) 1 MG tablet Take 1 mg by mouth daily.    . furosemide (LASIX) 20 MG tablet TAKE 1 TABLET (20 MG TOTAL) BY MOUTH DAILY. 90 tablet 1  . lisinopril (PRINIVIL,ZESTRIL) 20 MG tablet TAKE 1 TABLET BY MOUTH EVERY DAY 30 tablet 6  . methotrexate 2.5 MG tablet Take 2.5 mg by mouth daily.    . rosuvastatin (CRESTOR)  5 MG tablet Take 1 tablet (5 mg total) by mouth daily. 30 tablet 6   No current facility-administered medications for this visit.    Allergies:   Review of patient's allergies indicates no known allergies.   Social History:  The patient  reports that he quit smoking about 43 years ago. His smoking use included Cigarettes. He started smoking about 53 years ago. He has a 20 pack-year smoking history. He has never used smokeless tobacco. He reports that he does not drink alcohol or use illicit drugs.   Family History:  The patient's  family history includes Coronary artery disease in his  brother.    ROS:  Please see the history of present illness.   All other systems are reviewed and negative.    PHYSICAL EXAM: VS:  BP 124/66 mmHg  Pulse 67  Ht 5\' 9"  (1.753 m)  Wt 192 lb 6.4 oz (87.272 kg)  BMI 28.40 kg/m2 , BMI Body mass index is 28.4 kg/(m^2). GEN: Well nourished, well developed, in no acute distress, appears older than stated age 11: normal Neck: no JVD, carotid bruits, or masses Cardiac: RRR; no murmurs, rubs, or gallops,no edema  Respiratory:  clear to auscultation bilaterally, normal work of breathing GI: soft, nontender, nondistended, + BS MS: rheumatoid arthritic changes Skin: warm and dry  Neuro:  Strength and sensation are intact Psych: euthymic mood, full affect  EKG:  EKG is ordered today. The ekg ordered today shows sinus rhythm 67 bpm, PR 182, QRS 116, Qtc 454, septal infarct pattern, nonspecific St/T changes   Recent Labs: No results found for requested labs within last 365 days.    Lipid Panel     Component Value Date/Time   CHOL 119 05/12/2013 0043   TRIG 131 05/12/2013 0043   HDL 15* 05/12/2013 0043   CHOLHDL 7.9 05/12/2013 0043   VLDL 26 05/12/2013 0043   LDLCALC 78 05/12/2013 0043     Wt Readings from Last 3 Encounters:  05/07/15 192 lb 6.4 oz (87.272 kg)  04/25/15 192 lb (87.091 kg)  11/29/14 189 lb 12.8 oz (86.093 kg)      Other studies Reviewed: Additional studies/ records that were reviewed today include: Dr Chuck Hint notes, recent echo  Review of the above records today demonstrates: EF 30%   ASSESSMENT AND PLAN:  1.  Ischemic CM/ CAD/ chronic systolic dysfunction The patient has an ischemic CM (EF 30-35%), NYHA Class III CHF, and CAD. At this time, he meets MADIT II/ SCD-HeFT criteria for ICD implantation for primary prevention of sudden death.  QRS <130 msec and thus he is not a candidate for CRT.  He does not currently have bradycardia but does have severe LA enlargement and may benefit from Visia AF technology  to monitor for atrial fibrillation (MDT).  Risks, benefits, alternatives to ICD implantation were discussed in detail with the patient today. The patient  understands that the risks include but are not limited to bleeding, infection, pneumothorax, perforation, tamponade, vascular damage, renal failure, MI, stroke, death, inappropriate shocks, and lead dislodgement and wishes to proceed.  We will therefore schedule device implantation at the next available time.  We also discussed the importance of remote monitoring today.  2. HTN Stable No change required today  3. CAD  No ischemic symptoms  4. LA enlargement As above   Current medicines are reviewed at length with the patient today.   The patient does not have concerns regarding his medicines.  The following changes were made today:  none    Signed, [name], MD  05/07/2015 2:56 PM     [address] [place] [address] [phone] (office) [phone] (fax)

## 2015-05-07 NOTE — Addendum Note (Signed)
Addended by: Janan Halter F on: 05/07/2015 03:25 PM   Modules accepted: Orders

## 2015-05-07 NOTE — Patient Instructions (Addendum)
Medication Instructions:  Your physician recommends that you continue on your current medications as directed. Please refer to the Current Medication list given to you today.   Labwork: Your physician recommends that you return for lab work on 06/22/15 at 8:00am ---you do not have to fast   Testing/Procedures: Your physician has recommended that you have a defibrillator inserted. An implantable cardioverter defibrillator (ICD) is a small device that is placed in your chest or, in rare cases, your abdomen. This device uses electrical pulses or shocks to help control life-threatening, irregular heartbeats that could lead the heart to suddenly stop beating (sudden cardiac arrest). Leads are attached to the ICD that goes into your heart. This is done in the hospital and usually requires an overnight stay. Please see the instruction sheet given to you today for more information.  Please check in at the [place] [facility] on 07/06/15 at 8:30am.  Do not eat or drink after midnight the night before your procedure.  Do not take any mediations the morning of your procedure    Follow-Up: Your physician recommends that you schedule a follow-up appointment in: 10-14 days from 07/06/15 with device clinic for wound check and 3 months from 07/05/12 with Dr [name]   Any Other Special Instructions Will Be Listed Below (If Applicable).     If you need a refill on your cardiac medications before your next appointment, please call your pharmacy.

## 2015-05-11 NOTE — Addendum Note (Signed)
Addended by: Freada Bergeron on: 05/11/2015 03:29 PM   Modules accepted: Orders

## 2015-06-04 IMAGING — CR DG CHEST 1V PORT
1 series · 1 of 1 positions shown · non-contrast
Comparison: DG CHEST 2 VIEW dated 05/13/2013; CT CHEST W/O CM dated
05/13/2013

CLINICAL DATA: Postop CABG.

EXAM:
PORTABLE CHEST - 1 VIEW

[AP]
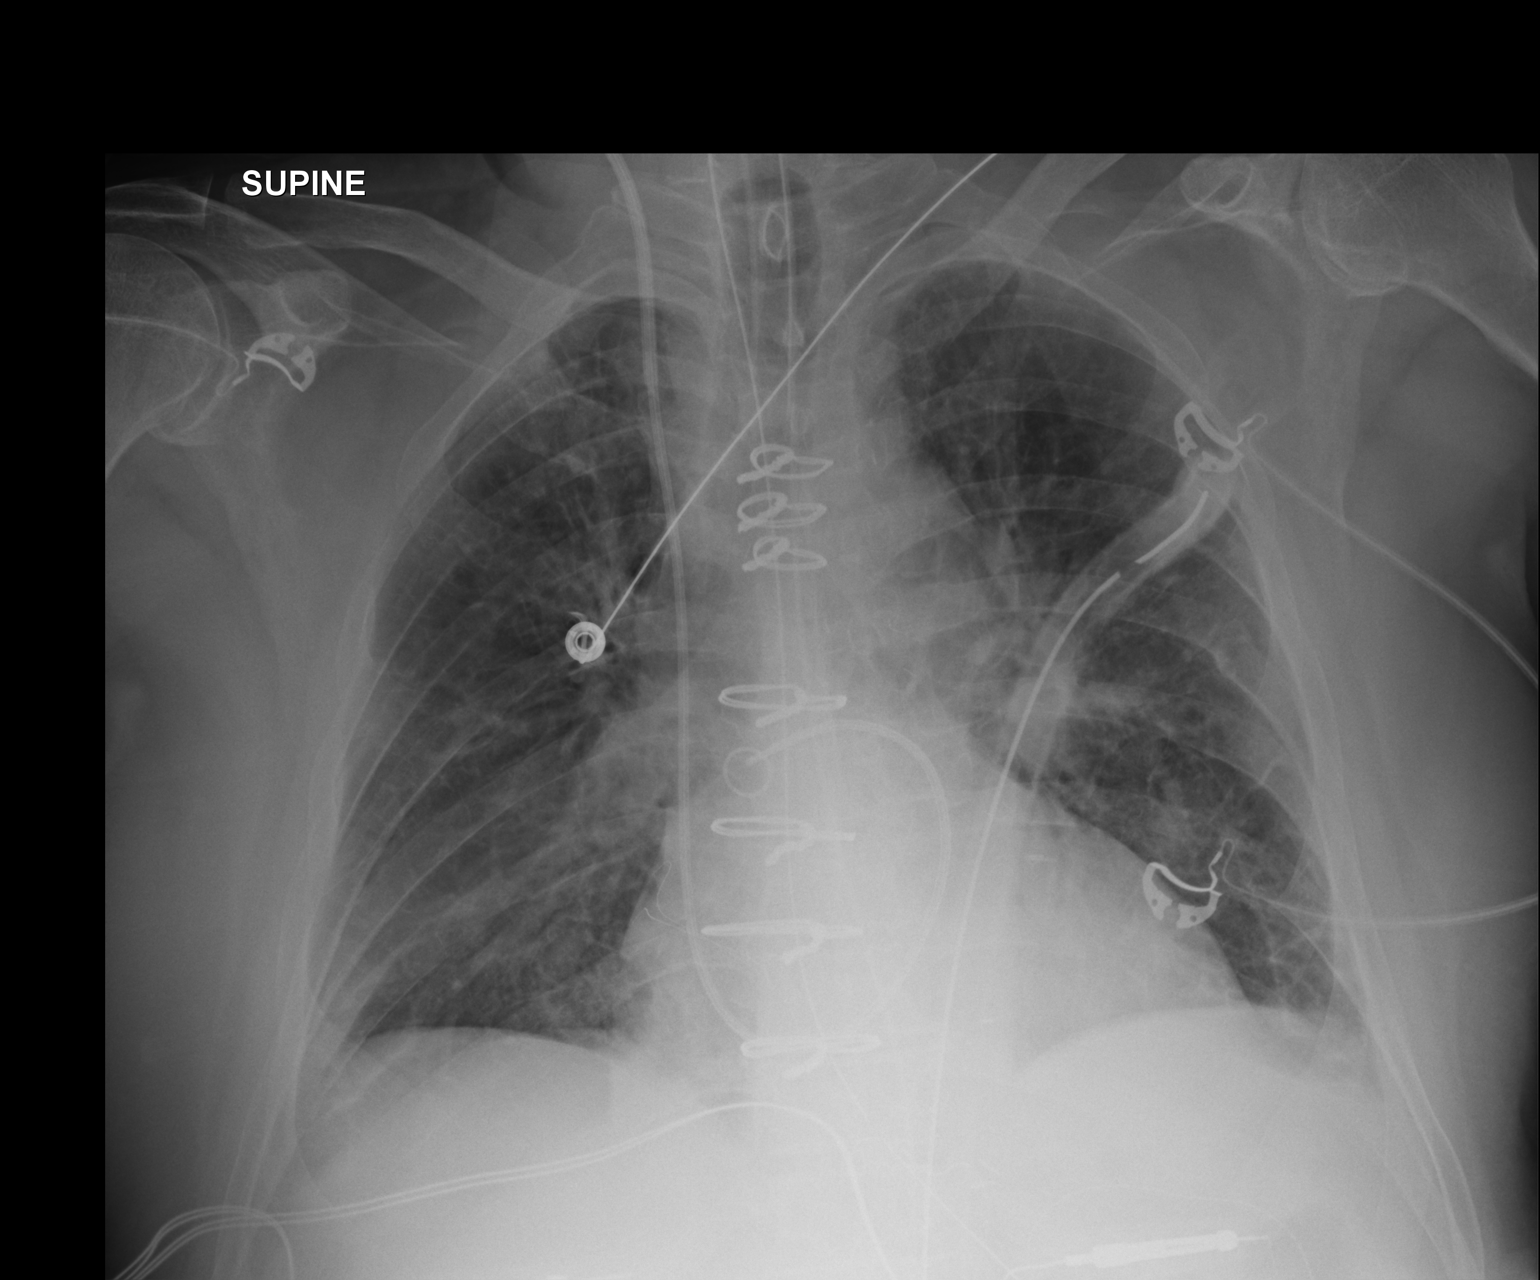

[1 of 1 positions shown; findings below may reference images not displayed]

FINDINGS: 4646 hr. Due to overlap with the additional support system, the tip
of the endotracheal tube is not well visualized. A nasogastric tube
projects below the diaphragm. Right IJ Swan-Ganz catheter tip is in
the proximal right pulmonary artery. Left chest tube is in place.
The heart size and mediastinal contours are stable status post
interval median sternotomy and CABG. The lungs are nearly clear
aside from mild basilar atelectasis. Old rib fractures are present
on the left. There is no pneumothorax.
IMPRESSION: 1. No demonstrated complication following CABG. There is mild
bibasilar atelectasis.
2. The endotracheal tube tip is not well visualized.

## 2015-06-05 IMAGING — DX DG CHEST 1V PORT
1 series · 1 of 1 positions shown · non-contrast
Comparison: May 16, 2013

CLINICAL DATA: Coronary artery disease

EXAM:
PORTABLE CHEST - 1 VIEW

[portable]
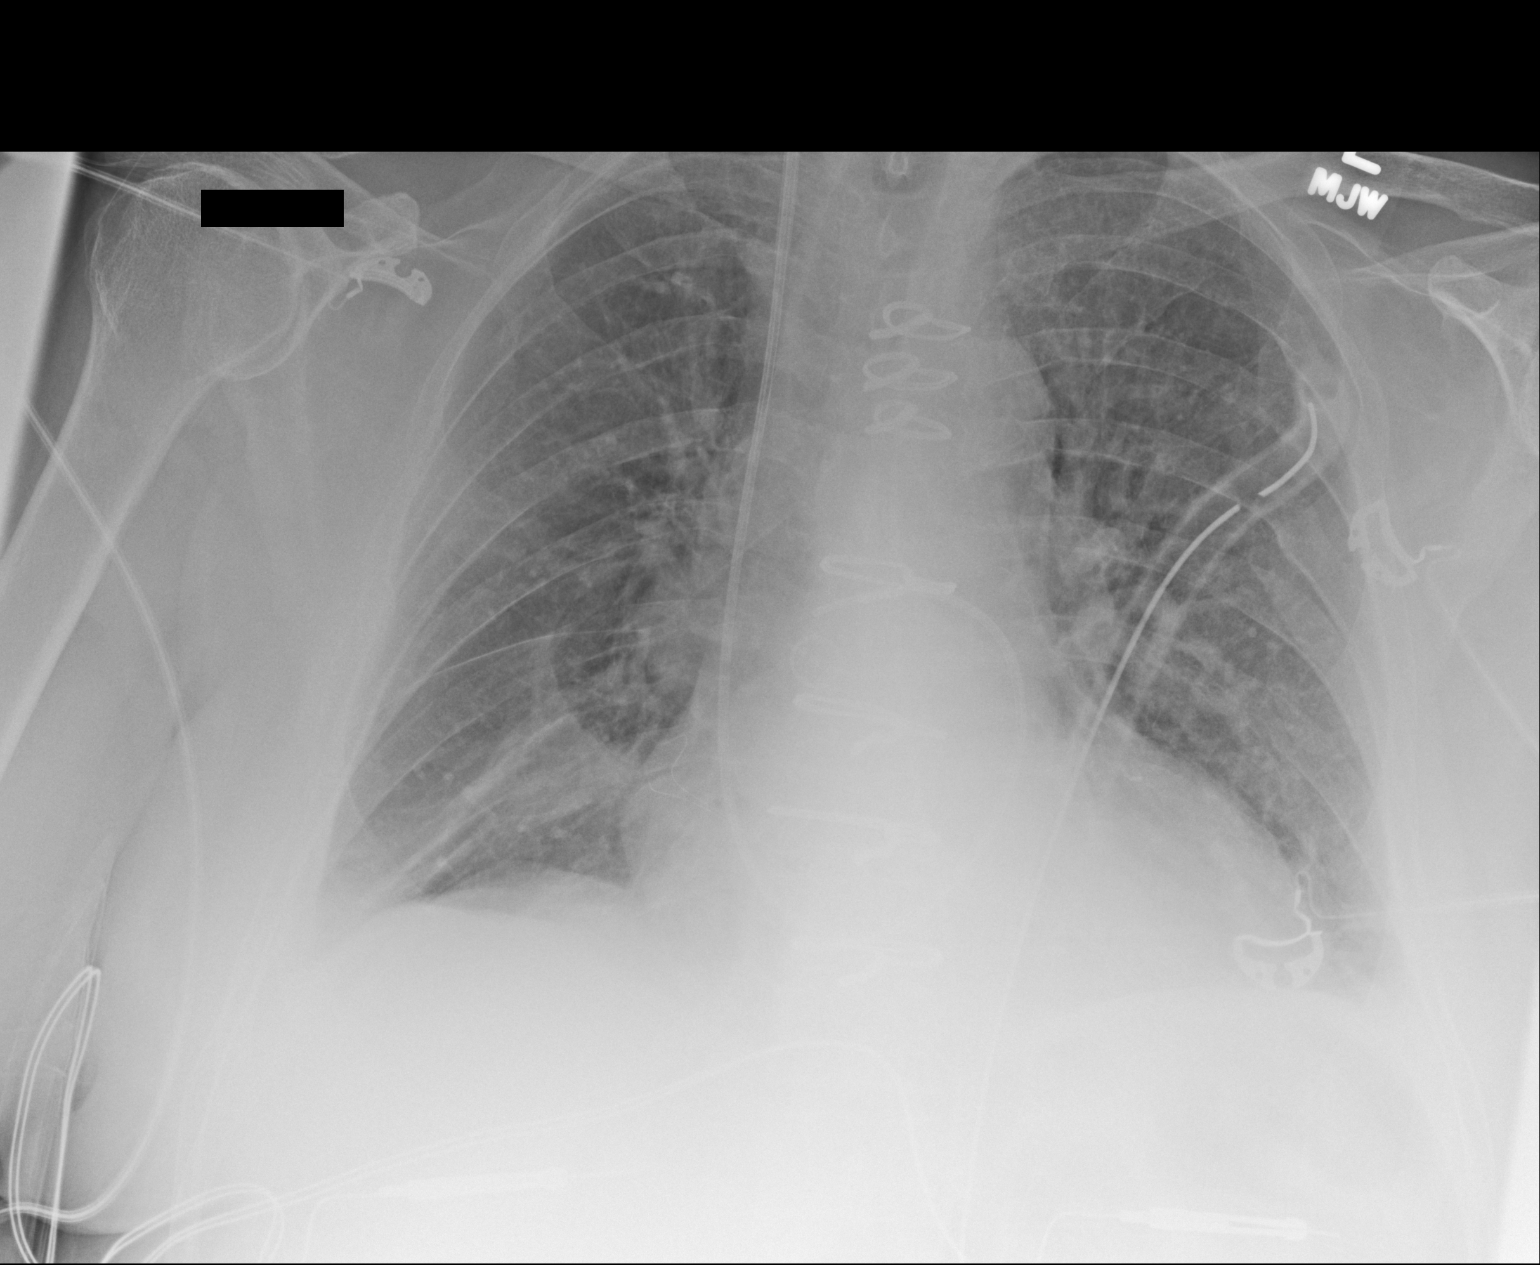

[1 of 1 positions shown; findings below may reference images not displayed]

FINDINGS: Endotracheal tube and nasogastric tube have been removed. Swan-Ganz
catheter tip is in the right main pulmonary artery. There is a
persistent mediastinal drain and a left chest tube. Temporary
pacemaker wires are present. There is a persistent minimal right
apical pneumothorax.

There is new focal consolidation in the right base. Elsewhere lungs
are clear. Heart is mildly enlarged with normal pulmonary
vascularity.
IMPRESSION: Tube and catheter positions as described. There is a stable minimal
right apical pneumothorax. New focus of consolidation right base.

## 2015-06-06 IMAGING — CR DG CHEST 1V PORT
1 series · 1 of 1 positions shown · non-contrast
Comparison: Chest x-ray 05/17/2013.

CLINICAL DATA: Status post CABG.

EXAM:
PORTABLE CHEST - 1 VIEW

[AP]
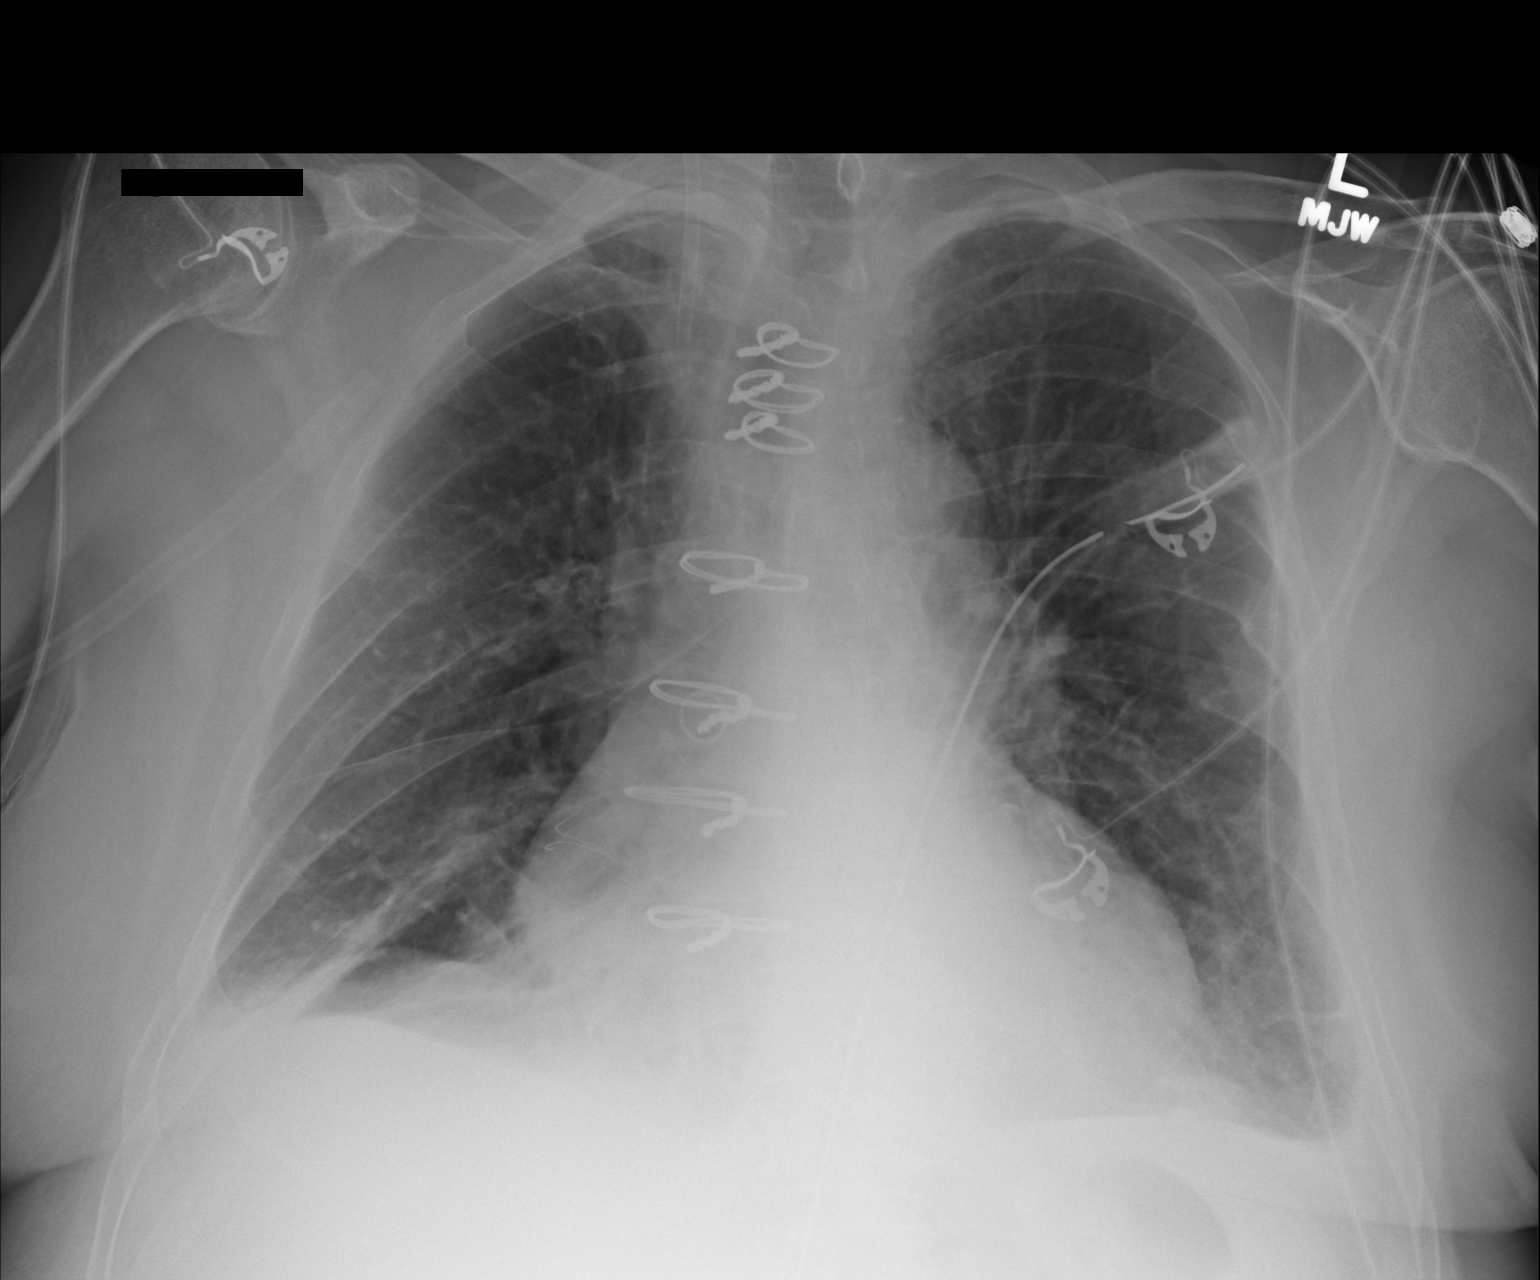

[1 of 1 positions shown; findings below may reference images not displayed]

FINDINGS: Previously noted Swan-Ganz catheter has been removed. Right IJ
Cordis with tip in the proximal superior vena cava is unchanged in
position, as is a left-sided chest tube with tip in the lateral
aspect of the upper left hemithorax. Bibasilar linear opacities are
favored to reflect some subsegmental atelectasis. Small left pleural
effusion. No appreciable pneumothorax identified at this time. No
definite consolidative airspace disease. Mild pulmonary venous
congestion, without frank pulmonary edema. Mild enlargement of the
cardiopericardial silhouette, similar to prior studies. The patient
is rotated to the right on today's exam, resulting in distortion of
the mediastinal contours and reduced diagnostic sensitivity and
specificity for mediastinal pathology. Status post median sternotomy
for CABG.
IMPRESSION: 1. Postoperative changes and support apparatus, as above.
2. Small amount of residual postoperative subsegmental atelectasis
in the lung bases bilaterally.
3. Small left pleural effusion.

## 2015-06-22 ENCOUNTER — Other Ambulatory Visit (INDEPENDENT_AMBULATORY_CARE_PROVIDER_SITE_OTHER): Payer: Medicare Other | Admitting: *Deleted

## 2015-06-22 DIAGNOSIS — I255 Ischemic cardiomyopathy: Secondary | ICD-10-CM | POA: Diagnosis not present

## 2015-06-22 LAB — BASIC METABOLIC PANEL
BUN: 20 mg/dL (ref 7–25)
CALCIUM: 8.4 mg/dL — AB (ref 8.6–10.3)
CO2: 26 mmol/L (ref 20–31)
CREATININE: 1.2 mg/dL — AB (ref 0.70–1.18)
Chloride: 101 mmol/L (ref 98–110)
GLUCOSE: 145 mg/dL — AB (ref 65–99)
POTASSIUM: 3.8 mmol/L (ref 3.5–5.3)
Sodium: 135 mmol/L (ref 135–146)

## 2015-06-22 LAB — CBC WITH DIFFERENTIAL/PLATELET
BASOS PCT: 0 %
Basophils Absolute: 0 cells/uL (ref 0–200)
EOS PCT: 13 %
Eosinophils Absolute: 611 cells/uL — ABNORMAL HIGH (ref 15–500)
HEMATOCRIT: 36.4 % — AB (ref 38.5–50.0)
Hemoglobin: 11.9 g/dL — ABNORMAL LOW (ref 13.2–17.1)
LYMPHS PCT: 25 %
Lymphs Abs: 1175 cells/uL (ref 850–3900)
MCH: 29.5 pg (ref 27.0–33.0)
MCHC: 32.7 g/dL (ref 32.0–36.0)
MCV: 90.3 fL (ref 80.0–100.0)
MONOS PCT: 8 %
MPV: 9.7 fL (ref 7.5–12.5)
Monocytes Absolute: 376 cells/uL (ref 200–950)
NEUTROS PCT: 54 %
Neutro Abs: 2538 cells/uL (ref 1500–7800)
PLATELETS: 108 10*3/uL — AB (ref 140–400)
RBC: 4.03 MIL/uL — AB (ref 4.20–5.80)
RDW: 14.3 % (ref 11.0–15.0)
WBC: 4.7 10*3/uL (ref 3.8–10.8)

## 2015-07-06 ENCOUNTER — Encounter (HOSPITAL_COMMUNITY): Payer: Self-pay | Admitting: Internal Medicine

## 2015-07-06 ENCOUNTER — Ambulatory Visit (HOSPITAL_COMMUNITY)
Admission: RE | Admit: 2015-07-06 | Discharge: 2015-07-06 | Disposition: A | Discharge status: Home / Self Care | Payer: Medicare Other | Source: Ambulatory Visit | Attending: Internal Medicine | Admitting: Internal Medicine

## 2015-07-06 ENCOUNTER — Encounter (HOSPITAL_COMMUNITY)
Admission: RE | Disposition: A | Discharge status: Home / Self Care | Payer: Self-pay | Source: Ambulatory Visit | Attending: Internal Medicine

## 2015-07-06 ENCOUNTER — Ambulatory Visit (HOSPITAL_COMMUNITY): Payer: Medicare Other

## 2015-07-06 DIAGNOSIS — M069 Rheumatoid arthritis, unspecified: Secondary | ICD-10-CM | POA: Insufficient documentation

## 2015-07-06 DIAGNOSIS — I272 Other secondary pulmonary hypertension: Secondary | ICD-10-CM | POA: Diagnosis not present

## 2015-07-06 DIAGNOSIS — I509 Heart failure, unspecified: Secondary | ICD-10-CM | POA: Diagnosis not present

## 2015-07-06 DIAGNOSIS — Z87891 Personal history of nicotine dependence: Secondary | ICD-10-CM | POA: Insufficient documentation

## 2015-07-06 DIAGNOSIS — I252 Old myocardial infarction: Secondary | ICD-10-CM | POA: Diagnosis not present

## 2015-07-06 DIAGNOSIS — Z951 Presence of aortocoronary bypass graft: Secondary | ICD-10-CM | POA: Diagnosis not present

## 2015-07-06 DIAGNOSIS — Z79899 Other long term (current) drug therapy: Secondary | ICD-10-CM | POA: Diagnosis not present

## 2015-07-06 DIAGNOSIS — E119 Type 2 diabetes mellitus without complications: Secondary | ICD-10-CM | POA: Insufficient documentation

## 2015-07-06 DIAGNOSIS — N183 Chronic kidney disease, stage 3 unspecified: Secondary | ICD-10-CM | POA: Diagnosis present

## 2015-07-06 DIAGNOSIS — I2510 Atherosclerotic heart disease of native coronary artery without angina pectoris: Secondary | ICD-10-CM | POA: Insufficient documentation

## 2015-07-06 DIAGNOSIS — I255 Ischemic cardiomyopathy: Secondary | ICD-10-CM | POA: Diagnosis not present

## 2015-07-06 DIAGNOSIS — I110 Hypertensive heart disease with heart failure: Secondary | ICD-10-CM | POA: Diagnosis not present

## 2015-07-06 DIAGNOSIS — Z950 Presence of cardiac pacemaker: Secondary | ICD-10-CM

## 2015-07-06 DIAGNOSIS — E785 Hyperlipidemia, unspecified: Secondary | ICD-10-CM | POA: Insufficient documentation

## 2015-07-06 DIAGNOSIS — Z7982 Long term (current) use of aspirin: Secondary | ICD-10-CM | POA: Insufficient documentation

## 2015-07-06 DIAGNOSIS — I5022 Chronic systolic (congestive) heart failure: Secondary | ICD-10-CM | POA: Diagnosis present

## 2015-07-06 DIAGNOSIS — Z959 Presence of cardiac and vascular implant and graft, unspecified: Secondary | ICD-10-CM

## 2015-07-06 HISTORY — PX: EP IMPLANTABLE DEVICE: SHX172B

## 2015-07-06 LAB — GLUCOSE, CAPILLARY
GLUCOSE-CAPILLARY: 80 mg/dL (ref 65–99)
Glucose-Capillary: 123 mg/dL — ABNORMAL HIGH (ref 65–99)

## 2015-07-06 LAB — SURGICAL PCR SCREEN
MRSA, PCR: POSITIVE — AB
Staphylococcus aureus: POSITIVE — AB

## 2015-07-06 SURGERY — ICD IMPLANT

## 2015-07-06 MED ORDER — HEPARIN (PORCINE) IN NACL 2-0.9 UNIT/ML-% IJ SOLN
INTRAMUSCULAR | Status: DC | PRN
  Administered 2015-07-06: 500 mL

## 2015-07-06 MED ORDER — CHLORHEXIDINE GLUCONATE CLOTH 2 % EX PADS: 6.0000 | MEDICATED_PAD | Freq: Every day | CUTANEOUS | Status: DC

## 2015-07-06 MED ORDER — MIDAZOLAM HCL 5 MG/5ML IJ SOLN
INTRAMUSCULAR | Status: AC
  Filled 2015-07-06: qty 5

## 2015-07-06 MED ORDER — ONDANSETRON HCL 4 MG/2ML IJ SOLN: 4.0000 mg | Freq: Four times a day (QID) | INTRAMUSCULAR | Status: DC | PRN

## 2015-07-06 MED ORDER — MUPIROCIN 2 % EX OINT
1.0000 "application " | TOPICAL_OINTMENT | Freq: Two times a day (BID) | CUTANEOUS | Status: DC
  Administered 2015-07-06: 1 via NASAL

## 2015-07-06 MED ORDER — SODIUM CHLORIDE 0.9% FLUSH: 3.0000 mL | INTRAVENOUS | Status: DC | PRN

## 2015-07-06 MED ORDER — HEPARIN (PORCINE) IN NACL 2-0.9 UNIT/ML-% IJ SOLN
INTRAMUSCULAR | Status: AC
  Filled 2015-07-06: qty 500

## 2015-07-06 MED ORDER — CHLORHEXIDINE GLUCONATE 4 % EX LIQD
60.0000 mL | Freq: Once | CUTANEOUS | Status: DC
  Filled 2015-07-06: qty 60

## 2015-07-06 MED ORDER — LIDOCAINE HCL (PF) 1 % IJ SOLN
INTRAMUSCULAR | Status: AC
  Filled 2015-07-06: qty 30

## 2015-07-06 MED ORDER — MIDAZOLAM HCL 5 MG/5ML IJ SOLN
INTRAMUSCULAR | Status: DC | PRN
  Administered 2015-07-06 (×2): 1 mg via INTRAVENOUS

## 2015-07-06 MED ORDER — LIDOCAINE HCL (PF) 1 % IJ SOLN
INTRAMUSCULAR | Status: DC | PRN
  Administered 2015-07-06: 50 mL

## 2015-07-06 MED ORDER — SODIUM CHLORIDE 0.9 % IV SOLN
INTRAVENOUS | Status: DC | PRN
Start: 2015-07-06 — End: 2015-07-06
  Administered 2015-07-06: 10 mL/h via INTRAVENOUS

## 2015-07-06 MED ORDER — MUPIROCIN 2 % EX OINT
TOPICAL_OINTMENT | Freq: Two times a day (BID) | CUTANEOUS | Status: DC
  Administered 2015-07-06: 1 via NASAL

## 2015-07-06 MED ORDER — CEFAZOLIN SODIUM 1-5 GM-% IV SOLN
1.0000 g | Freq: Four times a day (QID) | INTRAVENOUS | Status: DC
  Administered 2015-07-06: 1 g via INTRAVENOUS
  Filled 2015-07-06 (×3): qty 50

## 2015-07-06 MED ORDER — IOPAMIDOL (ISOVUE-370) INJECTION 76%
INTRAVENOUS | Status: DC | PRN
  Administered 2015-07-06: 15 mL via INTRAVENOUS

## 2015-07-06 MED ORDER — FENTANYL CITRATE (PF) 100 MCG/2ML IJ SOLN
INTRAMUSCULAR | Status: AC
  Filled 2015-07-06: qty 2

## 2015-07-06 MED ORDER — FOLIC ACID 1 MG PO TABS
1.0000 mg | ORAL_TABLET | Freq: Every day | ORAL | Status: DC
Start: 2015-07-07 — End: 2015-07-06

## 2015-07-06 MED ORDER — CYCLOSPORINE 100 MG PO CAPS
100.0000 mg | ORAL_CAPSULE | Freq: Every day | ORAL | Status: DC
Start: 2015-07-07 — End: 2015-07-06

## 2015-07-06 MED ORDER — ASPIRIN EC 81 MG PO TBEC: 81.0000 mg | DELAYED_RELEASE_TABLET | Freq: Every day | ORAL | Status: DC

## 2015-07-06 MED ORDER — MUPIROCIN 2 % EX OINT
TOPICAL_OINTMENT | CUTANEOUS | Status: AC
Start: 2015-07-06 — End: 2015-07-06
  Administered 2015-07-06: 1 via NASAL
  Filled 2015-07-06: qty 22

## 2015-07-06 MED ORDER — CEFAZOLIN SODIUM-DEXTROSE 2-4 GM/100ML-% IV SOLN
2.0000 g | INTRAVENOUS | Status: AC
  Administered 2015-07-06: 2 g via INTRAVENOUS

## 2015-07-06 MED ORDER — FENTANYL CITRATE (PF) 100 MCG/2ML IJ SOLN
INTRAMUSCULAR | Status: DC | PRN
  Administered 2015-07-06 (×2): 12.5 ug via INTRAVENOUS

## 2015-07-06 MED ORDER — ROSUVASTATIN CALCIUM 10 MG PO TABS: 5.0000 mg | ORAL_TABLET | Freq: Every day | ORAL | Status: DC

## 2015-07-06 MED ORDER — SODIUM CHLORIDE 0.9 % IV SOLN
INTRAVENOUS | Status: DC
  Administered 2015-07-06: 09:00:00 via INTRAVENOUS

## 2015-07-06 MED ORDER — SODIUM CHLORIDE 0.9 % IR SOLN
Status: AC
  Filled 2015-07-06: qty 2

## 2015-07-06 MED ORDER — CARVEDILOL 25 MG PO TABS: 25.0000 mg | ORAL_TABLET | Freq: Two times a day (BID) | ORAL | Status: DC

## 2015-07-06 MED ORDER — HYDROCODONE-ACETAMINOPHEN 5-325 MG PO TABS: 1.0000 | ORAL_TABLET | ORAL | Status: DC | PRN

## 2015-07-06 MED ORDER — ACETAMINOPHEN 325 MG PO TABS: 325.0000 mg | ORAL_TABLET | ORAL | Status: DC | PRN

## 2015-07-06 MED ORDER — CEFAZOLIN SODIUM-DEXTROSE 2-3 GM-% IV SOLR
INTRAVENOUS | Status: AC
  Filled 2015-07-06: qty 50

## 2015-07-06 MED ORDER — IOPAMIDOL (ISOVUE-370) INJECTION 76%
INTRAVENOUS | Status: AC
  Filled 2015-07-06: qty 50

## 2015-07-06 MED ORDER — SODIUM CHLORIDE 0.9 % IR SOLN
80.0000 mg | Status: AC
  Administered 2015-07-06: 80 mg

## 2015-07-06 MED ORDER — SODIUM CHLORIDE 0.9 % IV SOLN
250.0000 mL | INTRAVENOUS | Status: DC | PRN
Start: 2015-07-06 — End: 2015-07-06

## 2015-07-06 MED ORDER — SODIUM CHLORIDE 0.9% FLUSH: 3.0000 mL | Freq: Two times a day (BID) | INTRAVENOUS | Status: DC

## 2015-07-06 MED ORDER — LISINOPRIL 20 MG PO TABS: 20.0000 mg | ORAL_TABLET | Freq: Every day | ORAL | Status: DC

## 2015-07-06 SURGICAL SUPPLY — 7 items
CABLE SURGICAL S-101-97-12 (CABLE) ×3 IMPLANT
ICD VISIA MRI VR DVFB1D4 (ICD Generator) ×1 IMPLANT
LEAD SPRINT QUAT SEC 6935M-62 (Lead) ×3 IMPLANT
PAD DEFIB LIFELINK (PAD) ×3 IMPLANT
SHEATH CLASSIC 9F (SHEATH) ×3 IMPLANT
TRAY PACEMAKER INSERTION (PACKS) ×3 IMPLANT
VISIA MRI VR DVFB1D4 (ICD Generator) ×3 IMPLANT

## 2015-07-06 NOTE — H&P (Signed)
PCP: [name], MD Cardiologist: Dr Domenic Polite (referring) Primary Electrophysiologist: [name], MD   CC: CHF  History of Present Illness: Francis Wilkinson is a 69 y.o. male who presents today for ICD implantation. The patient has a h/o CAD s/p CABG in 2015, ischemic CM (EF 30%), and NYHA Class III CHF. He reports doing reasonably well since his bypass. He remains active. He reports SOB with walking <1/2 mile. Recent echo reveals EF 30%. He reports compliance with medicines.  Today, he denies symptoms of palpitations, chest pain, shortness of breath, orthopnea, PND, lower extremity edema, claudication, dizziness, presyncope, syncope, bleeding, or neurologic sequela. The patient is tolerating medications without difficulties and is otherwise without complaint today.    Past Medical History  Diagnosis Date  . Essential hypertension   . Hyperlipidemia   . Skin cancer     pt denies  . Diabetes mellitus, type II ([place])     diet controlled  . GERD (gastroesophageal reflux disease)   . Rheumatoid arthritis ([place])   . CAD (coronary artery disease)     Multivessel status post CABG February 2015   . Ischemic cardiomyopathy     LVEF 25-30% [name], February 2016   . Postoperative atrial fibrillation ([name])   . Mitral regurgitation     mild to moderate  . Tricuspid regurgitation     moderate  . Secondary pulmonary hypertension (HCC)     PASP 69 mmHg [name], February 2016   . Left atrial enlargement    Past Surgical History  Procedure Laterality Date  . Inguinal hernia repair Left ~ 1970  . Coronary artery bypass graft N/A 05/16/2013    Procedure: CORONARY ARTERY BYPASS GRAFTING (CABG); Surgeon: [name], MD; Location: [facility]; Service: Open Heart Surgery; Laterality: N/A; Times 2 using left internal mammary artery and endoscopically harvested right saphenous  vein.  . Intraoperative transesophageal echocardiogram N/A 05/16/2013    Procedure: INTRAOPERATIVE TRANSESOPHAGEAL ECHOCARDIOGRAM; Surgeon: [name], MD; Location: [facility]; Service: Open Heart Surgery; Laterality: N/A;  . Left heart catheterization with coronary angiogram N/A 05/12/2013    Procedure: LEFT HEART CATHETERIZATION WITH CORONARY ANGIOGRAM; Surgeon: [name], MD; Location: [facility] CATH LAB; Service: Cardiovascular; Laterality: N/A;     Current Outpatient Prescriptions  Medication Sig Dispense Refill  . acetaminophen (TYLENOL) 325 MG tablet Take 325-650 mg by mouth every 6 (six) hours as needed (pain).     [name] aspirin EC 81 MG tablet Take 1 tablet (81 mg total) by mouth daily. 90 tablet 3  . CALCIUM-VITAMIN D PO Take 1 tablet by mouth daily.    . carvedilol (COREG) 25 MG tablet Take 1 tablet (25 mg total) by mouth 2 (two) times daily. 180 tablet 3  . cycloSPORINE (SANDIMMUNE) 100 MG capsule Take 1 capsule by mouth daily.    . folic acid (FOLVITE) 1 MG tablet Take 1 mg by mouth daily.    . furosemide (LASIX) 20 MG tablet TAKE 1 TABLET (20 MG TOTAL) BY MOUTH DAILY. 90 tablet 1  . lisinopril (PRINIVIL,ZESTRIL) 20 MG tablet TAKE 1 TABLET BY MOUTH EVERY DAY 30 tablet 6  . methotrexate 2.5 MG tablet Take 2.5 mg by mouth daily.    . rosuvastatin (CRESTOR) 5 MG tablet Take 1 tablet (5 mg total) by mouth daily. 30 tablet 6   No current facility-administered medications for this visit.    Allergies: Review of patient's allergies indicates no known allergies.   Social History: The patient  reports that he quit  smoking about 43 years ago. His smoking use included Cigarettes. He started smoking about 53 years ago. He has a 20 pack-year smoking history. He has never used smokeless tobacco. He reports that he does not drink alcohol or use illicit drugs.   Family History: The patient's family history  includes Coronary artery disease in his brother.    ROS: Please see the history of present illness. All other systems are reviewed and negative.    PHYSICAL EXAM: Vitals pending GEN: Well nourished, well developed, in no acute distress, appears older than stated age 45: normal  Neck: no JVD, carotid bruits, or masses Cardiac: RRR; no murmurs, rubs, or gallops,no edema  Respiratory: clear to auscultation bilaterally, normal work of breathing GI: soft, nontender, nondistended, + BS MS: rheumatoid arthritic changes Skin: warm and dry  Neuro: Strength and sensation are intact Psych: euthymic mood, full affect  EKG: EKG is ordered today. The ekg ordered today shows sinus rhythm, QRS < 130 msec   Lipid Panel   Labs (Brief)       Component Value Date/Time   CHOL 119 05/12/2013 0043   TRIG 131 05/12/2013 0043   HDL 15* 05/12/2013 0043   CHOLHDL 7.9 05/12/2013 0043   VLDL 26 05/12/2013 0043   LDLCALC 78 05/12/2013 0043       Wt Readings from Last 3 Encounters:  05/07/15 192 lb 6.4 oz (87.272 kg)  04/25/15 192 lb (87.091 kg)  11/29/14 189 lb 12.8 oz (86.093 kg)      Other studies Reviewed: Additional studies/ records that were reviewed today include: Dr Chuck Hint notes, recent echo  Review of the above records today demonstrates: EF 30%   ASSESSMENT AND PLAN:  1. Ischemic CM/ CAD/ chronic systolic dysfunction The patient has an ischemic CM (EF 30%), NYHA Class III CHF, and CAD. At this time, he meets MADIT II/ SCD-HeFT criteria for ICD implantation for primary prevention of sudden death. QRS <130 msec and thus he is not a candidate for CRT. He does not currently have bradycardia but does have severe LA enlargement and may benefit from Visia AF technology to monitor for atrial fibrillation (MDT). Risks, benefits, alternatives to ICD implantation were discussed in detail with the patient today. The patient understands  that the risks include but are not limited to bleeding, infection, pneumothorax, perforation, tamponade, vascular damage, renal failure, MI, stroke, death, inappropriate shocks, and lead dislodgement and wishes to proceed.  [name] MD, [facility] 07/06/2015 8:58 AM   ICD Criteria  Current LVEF:30%. Within 12 months prior to implant: Yes   Heart failure history: Yes, Class III  Cardiomyopathy history: Yes, Ischemic Cardiomyopathy. (prior MI)  Atrial Fibrillation/Atrial Flutter: yes, remote AF posteroperatively s/p CABG but none since.  Not on anticoagualtion due to no recurrence.  Ventricular tachycardia history: No.  Cardiac arrest history: No.  History of syndromes with risk of sudden death: No.  Previous ICD: No.  Current ICD indication: Primary  PPM indication: No.   Class I or II Bradycardia indication present: no  Beta Blocker therapy for 3 or more months: Yes, prescribed.   Ace Inhibitor/ARB therapy for 3 or more months: Yes, prescribed.

## 2015-07-06 NOTE — Discharge Instructions (Signed)
° °  Supplemental Discharge Instructions for  Pacemaker/Defibrillator Patients  Activity No heavy lifting or vigorous activity with your left/right arm for 6 to 8 weeks.  Do not raise your left/right arm above your head for one week.  Gradually raise your affected arm as drawn below.                       04/26                     04/27                              04/28                            04/29            NO DRIVING for  1 week    ; you may begin driving on     [id number]     . WOUND CARE - Keep the wound area clean and dry.  Do not get this area wet for one week. No showers for one week; you may shower on      04/29        . - The tape/steri-strips on your wound will fall off; do not pull them off.  No bandage is needed on the site.  DO  NOT apply any creams, oils, or ointments to the wound area. - If you notice any drainage or discharge from the wound, any swelling or bruising at the site, or you develop a fever > 101? F after you are discharged home, call the office at once.  Special Instructions - You are still able to use cellular telephones; use the ear opposite the side where you have your pacemaker/defibrillator.  Avoid carrying your cellular phone near your device. - When traveling through airports, show security personnel your identification card to avoid being screened in the metal detectors.  Ask the security personnel to use the hand wand. - Avoid arc welding equipment, MRI testing (magnetic resonance imaging), TENS units (transcutaneous nerve stimulators).  Call the office for questions about other devices. - Avoid electrical appliances that are in poor condition or are not properly grounded. - Microwave ovens are safe to be near or to operate.  Additional information for defibrillator patients should your device go off: - If your device goes off ONCE and you feel fine afterward, notify the device clinic nurses. - If your device goes off ONCE and you do not feel well  afterward, call 911. - If your device goes off TWICE, call 911. - If your device goes off THREE times in one day, call 911.  DO NOT DRIVE YOURSELF OR A FAMILY MEMBER WITH A DEFIBRILLATOR TO THE HOSPITAL--CALL 911.

## 2015-07-07 ENCOUNTER — Telehealth: Payer: Self-pay | Admitting: Nurse Practitioner

## 2015-07-07 NOTE — Telephone Encounter (Signed)
Pt s/p ICD implant 4/21 with same day discharge.  Remote transmission reviewed this morning. Parameters stable. Normal device function.   Spoke with wife, she is aware and states incision without hematoma/ecchymosis. She will remove Tegaderm today.    Chanetta Marshall, NP 07/07/2015 9:12 AM

## 2015-07-16 ENCOUNTER — Ambulatory Visit (INDEPENDENT_AMBULATORY_CARE_PROVIDER_SITE_OTHER): Payer: Medicare Other | Admitting: *Deleted

## 2015-07-16 ENCOUNTER — Encounter: Payer: Self-pay | Admitting: Internal Medicine

## 2015-07-16 DIAGNOSIS — I255 Ischemic cardiomyopathy: Secondary | ICD-10-CM | POA: Diagnosis not present

## 2015-07-16 LAB — CUP PACEART INCLINIC DEVICE CHECK
Battery Remaining Longevity: 138 mo
Battery Voltage: 3.14 V
Date Time Interrogation Session: 20170501172254
HighPow Impedance: 56 Ohm
Implantable Lead Implant Date: 20170421
Implantable Lead Location: 753860
Lead Channel Sensing Intrinsic Amplitude: 12.625 mV
Lead Channel Setting Pacing Amplitude: 3.5 V
Lead Channel Setting Pacing Pulse Width: 0.4 ms
Lead Channel Setting Sensing Sensitivity: 0.3 mV
MDC IDC MSMT LEADCHNL RV IMPEDANCE VALUE: 304 Ohm
MDC IDC MSMT LEADCHNL RV IMPEDANCE VALUE: 418 Ohm
MDC IDC MSMT LEADCHNL RV PACING THRESHOLD AMPLITUDE: 0.75 V
MDC IDC MSMT LEADCHNL RV PACING THRESHOLD PULSEWIDTH: 0.4 ms
MDC IDC STAT BRADY RV PERCENT PACED: 0.03 %

## 2015-07-16 NOTE — Progress Notes (Signed)
Wound check appointment. Steri-strips removed. Wound without redness or edema. Incision edges approximated with exception of small, healing area on left lateral incision. Per JA, applied bandaid and instructed patient to keep dry and intact until Thursday. Patient and wife aware to call if signs/symptoms of infection develop.  Normal device function. Thresholds, sensing, and impedances consistent with implant measurements. Device programmed at 3.5V for extra safety margin until 3 month visit. Histogram distribution appropriate for patient and level of activity. No ventricular arrhythmias noted. Patient educated about wound care, arm mobility, lifting restrictions, shock plan. ROV with JA in 3 months.

## 2015-07-23 ENCOUNTER — Other Ambulatory Visit: Payer: Self-pay | Admitting: Cardiology

## 2015-08-12 ENCOUNTER — Other Ambulatory Visit: Payer: Self-pay | Admitting: Cardiology

## 2015-09-21 ENCOUNTER — Other Ambulatory Visit: Payer: Self-pay | Admitting: Cardiology

## 2015-10-08 ENCOUNTER — Ambulatory Visit (INDEPENDENT_AMBULATORY_CARE_PROVIDER_SITE_OTHER): Payer: Medicare Other | Admitting: Internal Medicine

## 2015-10-08 ENCOUNTER — Encounter: Payer: Self-pay | Admitting: Internal Medicine

## 2015-10-08 VITALS — BP 108/58 | HR 64 | Ht 69.0 in | Wt 196.6 lb

## 2015-10-08 DIAGNOSIS — I5022 Chronic systolic (congestive) heart failure: Secondary | ICD-10-CM

## 2015-10-08 DIAGNOSIS — I493 Ventricular premature depolarization: Secondary | ICD-10-CM | POA: Diagnosis not present

## 2015-10-08 DIAGNOSIS — I255 Ischemic cardiomyopathy: Secondary | ICD-10-CM | POA: Diagnosis not present

## 2015-10-08 DIAGNOSIS — I10 Essential (primary) hypertension: Secondary | ICD-10-CM

## 2015-10-08 NOTE — Progress Notes (Signed)
Referred to ICM clinic by Dr [name].  Met patient in office and he agreed to monthly ICM calls.  He has home monitor.  Provided phone number and encouraged to call for any fluid symptoms.  1st ICM transmission scheduled for 11/08/2015.

## 2015-10-08 NOTE — Patient Instructions (Addendum)
Medication Instructions:  Your physician recommends that you continue on your current medications as directed. Please refer to the Current Medication list given to you today.   Labwork: None ordered   Testing/Procedures: None ordered   Follow-Up: Remote monitoring is used to monitor your  ICD from home. This monitoring reduces the number of office visits required to check your device to one time per year. It allows [place] to keep an eye on the functioning of your device to ensure it is working properly. You are scheduled for a device check from home on 01/07/16. You may send your transmission at any time that day. If you have a wireless device, the transmission will be sent automatically. After your physician reviews your transmission, you will receive a postcard with your next transmission date.  Your physician wants you to follow-up in: 12 months with Dr [name] in [place] will receive a reminder letter in the mail two months in advance. If you don't receive a letter, please call our office to schedule the follow-up appointment.  Sharman Cheek, RN will call you monthly to follow fluid levels     Any Other Special Instructions Will Be Listed Below (If Applicable).     If you need a refill on your cardiac medications before your next appointment, please call your pharmacy.

## 2015-10-08 NOTE — Progress Notes (Signed)
PCP: [name], MD Primary Cardiologist:  Dr Domenic Polite Primary EP:  Dr Ma Rings is a 70 y.o. male who presents today for routine electrophysiology followup.  Since his ICD implant, the patient reports doing very well.  Denies procedure related complications.  Today, he denies symptoms of palpitations, chest pain, shortness of breath,  lower extremity edema, dizziness, presyncope, syncope, or ICD shocks.  The patient is otherwise without complaint today.   Past Medical History:  Diagnosis Date  . CAD (coronary artery disease)    Multivessel status post CABG February 2015   . Diabetes mellitus, type II ([place])    diet controlled  . Essential hypertension   . GERD (gastroesophageal reflux disease)   . Hyperlipidemia   . Ischemic cardiomyopathy    LVEF 25-30% Francis Wilkinson, February 2016   . Left atrial enlargement   . Mitral regurgitation    mild to moderate  . Postoperative atrial fibrillation ([place])   . Rheumatoid arthritis ([place])   . Secondary pulmonary hypertension (HCC)    PASP 69 mmHg  Francis Wilkinson, February 2016   . Skin cancer    pt denies  . Tricuspid regurgitation    moderate   Past Surgical History:  Procedure Laterality Date  . CORONARY ARTERY BYPASS GRAFT N/A 05/16/2013   Procedure: CORONARY ARTERY BYPASS GRAFTING (CABG);  Surgeon: [name], MD;  Location: [facility];  Service: Open Heart Surgery;  Laterality: N/A;  Times 2 using left internal mammary artery and endoscopically harvested right saphenous vein.  . EP IMPLANTABLE DEVICE N/A 07/06/2015   Procedure: ICD Implant;  Surgeon: [name], MD;  Location: [facility] CV LAB;  Service: Cardiovascular;  Laterality: N/A;  . INGUINAL HERNIA REPAIR Left ~ 1970  . INTRAOPERATIVE TRANSESOPHAGEAL ECHOCARDIOGRAM N/A 05/16/2013   Procedure: INTRAOPERATIVE TRANSESOPHAGEAL ECHOCARDIOGRAM;  Surgeon: [name], MD;  Location: [facility];  Service: Open Heart Surgery;  Laterality: N/A;  . LEFT HEART  CATHETERIZATION WITH CORONARY ANGIOGRAM N/A 05/12/2013   Procedure: LEFT HEART CATHETERIZATION WITH CORONARY ANGIOGRAM;  Surgeon: [name], MD;  Location: [facility] CATH LAB;  Service: Cardiovascular;  Laterality: N/A;    ROS- all systems are reviewed and negative except as per HPI above  Current Outpatient Prescriptions  Medication Sig Dispense Refill  . acetaminophen (TYLENOL) 325 MG tablet Take 325-650 mg by mouth daily as needed (pain).     [name] aspirin 81 MG EC tablet TAKE 1 TABLET (81 MG TOTAL) BY MOUTH DAILY. 90 tablet 3  . CALCIUM-VITAMIN D PO Take 1 tablet by mouth daily.    . carvedilol (COREG) 25 MG tablet TAKE 1 TABLET (25 MG TOTAL) BY MOUTH 2 (TWO) TIMES DAILY. 180 tablet 0  . cycloSPORINE (SANDIMMUNE) 100 MG capsule Take 100 mg by mouth daily.     . folic acid (FOLVITE) 1 MG tablet Take 1 mg by mouth daily.    . furosemide (LASIX) 20 MG tablet TAKE 1 TABLET (20 MG TOTAL) BY MOUTH DAILY. 90 tablet 3  . lisinopril (PRINIVIL,ZESTRIL) 20 MG tablet TAKE 1 TABLET BY MOUTH EVERY DAY 30 tablet 6  . methotrexate 2.5 MG tablet Take 15 mg by mouth every Monday.     [name] NIFEdipine (PROCARDIA-XL/ADALAT-CC/NIFEDICAL-XL) 30 MG 24 hr tablet Take 1 tablet by mouth daily.    . rosuvastatin (CRESTOR) 5 MG tablet Take 1 tablet (5 mg total) by mouth daily. 30 tablet 6   No current facility-administered medications for this visit.     Physical Exam: Vitals:  10/08/15 1425  BP: (!) 108/58  Pulse: 64  Weight: 196 lb 9.6 oz (89.2 kg)  Height: 5\' 9"  (1.753 m)    GEN- The patient is well appearing, alert and oriented x 3 today.   Head- normocephalic, atraumatic Eyes-  Sclera clear, conjunctiva pink Ears- hearing intact Oropharynx- clear Lungs- Clear to ausculation bilaterally, normal work of breathing Chest- ICD pocket is well healed Heart- Regular rate and rhythm 2/6 SEM GI- soft, NT, ND, + BS Extremities- no clubbing, cyanosis, or edema Musculoskeletal: No kyphosis. Arthritic deformities in  hands and feet consistent with rheumatoid arthritis. Neuropsychiatric: Alert and oriented x3, affect grossly appropriate.  ICD interrogation- reviewed in detail today,  See PACEART report  Assessment and Plan:  1.  Chronic systolic dysfunction/ ischemic CM/ CAD euvolemic today No ischemic symptoms Stable on an appropriate medical regimen Normal ICD function See Pace Art report No changes today Will enroll in ICM device clinic  2. HTN Stable No change required today  3. CAD No ischemic symptoms  carelink Follow in ICM clinic Follow-up with Dr Domenic Polite as scheduled I will see again in 1 year in the [facility] office  [name] MD, [facility] 10/08/2015 2:52 PM

## 2015-10-09 LAB — CUP PACEART INCLINIC DEVICE CHECK
Battery Voltage: 3.15 V
Brady Statistic RV Percent Paced: 0.42 %
HighPow Impedance: 64 Ohm
Implantable Lead Location: 753860
Lead Channel Impedance Value: 304 Ohm
Lead Channel Impedance Value: 418 Ohm
Lead Channel Sensing Intrinsic Amplitude: 11.25 mV
Lead Channel Setting Pacing Amplitude: 2.25 V
MDC IDC LEAD IMPLANT DT: 20170421
MDC IDC MSMT BATTERY REMAINING LONGEVITY: 137 mo
MDC IDC MSMT LEADCHNL RV PACING THRESHOLD AMPLITUDE: 1.125 V
MDC IDC MSMT LEADCHNL RV PACING THRESHOLD PULSEWIDTH: 0.4 ms
MDC IDC MSMT LEADCHNL RV SENSING INTR AMPL: 12.875 mV
MDC IDC SESS DTM: 20170724183253
MDC IDC SET LEADCHNL RV PACING PULSEWIDTH: 0.4 ms
MDC IDC SET LEADCHNL RV SENSING SENSITIVITY: 0.3 mV

## 2015-11-08 ENCOUNTER — Ambulatory Visit (INDEPENDENT_AMBULATORY_CARE_PROVIDER_SITE_OTHER): Payer: Medicare Other

## 2015-11-08 DIAGNOSIS — Z95810 Presence of automatic (implantable) cardiac defibrillator: Secondary | ICD-10-CM | POA: Diagnosis not present

## 2015-11-08 DIAGNOSIS — I5022 Chronic systolic (congestive) heart failure: Secondary | ICD-10-CM

## 2015-11-08 NOTE — Progress Notes (Signed)
EPIC Encounter for ICM Monitoring  Patient Name: Francis Wilkinson is a 70 y.o. male Date: 11/08/2015 Primary Care Physican: [name], MD Primary Cardiologist: Domenic Polite Electrophysiologist: Allred Dry Weight: 194 lb   1st ICM encounter.  Heart Failure questions reviewed, pt symptomatic with 3 lb weight gain.  He was on vacation for a week and ate foods higher in sodium  Thoracic impedance abnormal suggesting fluid accumulation.  Recommendations:  Increase Furosemide 20 mg to 1 tablet bid x 3 days and returned to prescribed dosage of 20 mg 1 tablet daily after 3rd day.  He verbalized understanding.  Repeat transmission 8/31  Follow-up plan: ICM clinic phone appointment on 11/15/2015.  Copy of ICM check sent to primary cardiologist and device physician.   ICM trend: 11/08/2015       Rosalene Billings, RN 11/08/2015 3:20 PM

## 2015-11-15 ENCOUNTER — Ambulatory Visit (INDEPENDENT_AMBULATORY_CARE_PROVIDER_SITE_OTHER): Payer: Medicare Other

## 2015-11-15 DIAGNOSIS — Z95810 Presence of automatic (implantable) cardiac defibrillator: Secondary | ICD-10-CM

## 2015-11-15 DIAGNOSIS — I5022 Chronic systolic (congestive) heart failure: Secondary | ICD-10-CM

## 2015-11-15 NOTE — Progress Notes (Signed)
EPIC Encounter for ICM Monitoring  Patient Name: Francis Wilkinson is a 70 y.o. male Date: 11/15/2015 Primary Care Physican: [name], MD Primary Cardiologist: Domenic Polite Electrophysiologist: Allred Dry Weight: 191 lb        Heart Failure questions reviewed, pt asymptomatic.   Thoracic impedance returned to normal after increase in Furosemide x 3 days.   Recommendations: No changes.  Low sodium diet education provided.    Follow-up plan: ICM clinic phone appointment on 12/17/2015.  Copy of ICM check sent to primary cardiologist and device physician.   ICM trend: 11/12/2015       Rosalene Billings, RN 11/15/2015 12:31 PM

## 2015-12-07 ENCOUNTER — Telehealth: Payer: Self-pay

## 2015-12-07 NOTE — Telephone Encounter (Signed)
Attempted ICM call to patient.  Left detailed message that next ICM remote transmission rescheduled from 12/17/2015 to 12/19/2015.  Advised transmission will be sent automatically.  Left ICM phone number for call back if any questions.

## 2015-12-19 ENCOUNTER — Ambulatory Visit (INDEPENDENT_AMBULATORY_CARE_PROVIDER_SITE_OTHER): Payer: Medicare Other

## 2015-12-19 DIAGNOSIS — I5022 Chronic systolic (congestive) heart failure: Secondary | ICD-10-CM | POA: Diagnosis not present

## 2015-12-19 DIAGNOSIS — Z95810 Presence of automatic (implantable) cardiac defibrillator: Secondary | ICD-10-CM

## 2015-12-19 NOTE — Progress Notes (Signed)
EPIC Encounter for ICM Monitoring  Patient Name: Francis Wilkinson is a 70 y.o. male Date: 12/19/2015 Primary Care Physican: [name], MD Primary Cardiologist:[place] Electrophysiologist: Allred Dry Weight: 191 lb        Heart Failure questions reviewed, pt asymptomatic   Thoracic impedance normal   Recommendations: No changes.  Low sodium diet education provided.    Follow-up plan: ICM clinic phone appointment on 01/23/2016.  Copy of ICM check sent to device physician.   ICM trend: 12/19/2015       Rosalene Billings, RN 12/19/2015 4:28 PM

## 2016-01-01 ENCOUNTER — Other Ambulatory Visit: Payer: Self-pay | Admitting: Cardiology

## 2016-01-23 ENCOUNTER — Ambulatory Visit (INDEPENDENT_AMBULATORY_CARE_PROVIDER_SITE_OTHER): Payer: Medicare Other | Admitting: *Deleted

## 2016-01-23 DIAGNOSIS — Z95810 Presence of automatic (implantable) cardiac defibrillator: Secondary | ICD-10-CM

## 2016-01-23 DIAGNOSIS — I5022 Chronic systolic (congestive) heart failure: Secondary | ICD-10-CM | POA: Diagnosis not present

## 2016-01-23 DIAGNOSIS — I255 Ischemic cardiomyopathy: Secondary | ICD-10-CM | POA: Diagnosis not present

## 2016-01-23 NOTE — Progress Notes (Signed)
Remote ICD transmission.   

## 2016-01-24 ENCOUNTER — Encounter: Payer: Self-pay | Admitting: Cardiology

## 2016-01-24 ENCOUNTER — Telehealth: Payer: Self-pay

## 2016-01-24 NOTE — Telephone Encounter (Signed)
Remote ICM transmission received.  Attempted patient call and left detailed message regarding transmission and next ICM scheduled for 02/25/2016.  Advised to return call for any fluid symptoms or questions.

## 2016-01-24 NOTE — Progress Notes (Signed)
EPIC Encounter for ICM Monitoring  Patient Name: Francis Wilkinson is a 70 y.o. male Date: 01/24/2016 Primary Care Physican: [name], MD Primary Cardiologist:[place] Electrophysiologist: Allred Dry Weight: unknown       Attempted ICM call and unable to reach. Left detailed message regarding transmission.  Transmission reviewed.   Thoracic impedance normal     Follow-up plan: ICM clinic phone appointment on 02/25/2016.  Copy of ICM check sent to device physician.   ICM trend: 01/23/2016       Rosalene Billings, RN 01/24/2016 9:06 AM

## 2016-01-28 ENCOUNTER — Other Ambulatory Visit: Payer: Self-pay | Admitting: Cardiology

## 2016-02-18 ENCOUNTER — Other Ambulatory Visit: Payer: Self-pay | Admitting: Cardiology

## 2016-02-19 LAB — CUP PACEART REMOTE DEVICE CHECK
Date Time Interrogation Session: 20171108083623
HighPow Impedance: 66 Ohm
Implantable Lead Implant Date: 20170421
Implantable Lead Location: 753860
Implantable Pulse Generator Implant Date: 20170421
Lead Channel Pacing Threshold Amplitude: 0.875 V
Lead Channel Pacing Threshold Pulse Width: 0.4 ms
Lead Channel Sensing Intrinsic Amplitude: 12 mV
Lead Channel Setting Pacing Pulse Width: 0.4 ms
MDC IDC MSMT BATTERY REMAINING LONGEVITY: 136 mo
MDC IDC MSMT BATTERY VOLTAGE: 3.1 V
MDC IDC MSMT LEADCHNL RV IMPEDANCE VALUE: 285 Ohm
MDC IDC MSMT LEADCHNL RV IMPEDANCE VALUE: 399 Ohm
MDC IDC MSMT LEADCHNL RV SENSING INTR AMPL: 12 mV
MDC IDC SET LEADCHNL RV PACING AMPLITUDE: 2 V
MDC IDC SET LEADCHNL RV SENSING SENSITIVITY: 0.3 mV
MDC IDC STAT BRADY RV PERCENT PACED: 4.7 %

## 2016-02-23 ENCOUNTER — Other Ambulatory Visit: Payer: Self-pay | Admitting: Cardiology

## 2016-02-25 ENCOUNTER — Ambulatory Visit (INDEPENDENT_AMBULATORY_CARE_PROVIDER_SITE_OTHER): Payer: Medicare Other

## 2016-02-25 DIAGNOSIS — I5022 Chronic systolic (congestive) heart failure: Secondary | ICD-10-CM

## 2016-02-25 DIAGNOSIS — Z95810 Presence of automatic (implantable) cardiac defibrillator: Secondary | ICD-10-CM

## 2016-02-26 NOTE — Progress Notes (Signed)
EPIC Encounter for ICM Monitoring  Patient Name: Francis Wilkinson is a 70 y.o. male Date: 02/26/2016 Primary Care Physican: [name], MD Primary Cardiologist:[place] Electrophysiologist: Allred Dry Weight: 191 lbs      Heart Failure questions reviewed, pt asymptomatic   Thoracic impedance normal   Recommendations: No changes.  Reinforced to limit low salt food choices to 2000 mg day and limiting fluid intake to < 2 liters per day. Encouraged to call for fluid symptoms.    Follow-up plan: ICM clinic phone appointment on 03/31/2016.  Copy of ICM check sent to device physician.   ICM trend: 02/25/2016       Rosalene Billings, RN 02/26/2016 2:02 PM

## 2016-03-23 ENCOUNTER — Other Ambulatory Visit: Payer: Self-pay | Admitting: Cardiology

## 2016-03-31 ENCOUNTER — Ambulatory Visit (INDEPENDENT_AMBULATORY_CARE_PROVIDER_SITE_OTHER): Payer: Medicare Other

## 2016-03-31 DIAGNOSIS — I5022 Chronic systolic (congestive) heart failure: Secondary | ICD-10-CM | POA: Diagnosis not present

## 2016-03-31 DIAGNOSIS — Z95810 Presence of automatic (implantable) cardiac defibrillator: Secondary | ICD-10-CM

## 2016-03-31 NOTE — Progress Notes (Signed)
EPIC Encounter for ICM Monitoring  Patient Name: Francis Wilkinson is a 71 y.o. male Date: 03/31/2016 Primary Care Physican: [name], MD Primary Cardiologist:[place] Electrophysiologist: Allred Dry Weight: 189 lbs            Heart Failure questions reviewed, pt asymptomatic   Thoracic impedance normal.  Recommendations: No changes.  Encouraged to call for fluid symptoms.  Follow-up plan: ICM clinic phone appointment on 05/01/2016.  Office appointment with Dr Domenic Polite on 05/02/2016  Copy of ICM check sent to primary cardiologist and device physician.   3 month ICM trend: 03/31/2016   1 Year ICM trend:      Rosalene Billings, RN 03/31/2016 8:22 AM

## 2016-04-03 ENCOUNTER — Ambulatory Visit: Payer: Medicare Other | Admitting: Cardiology

## 2016-04-23 NOTE — Progress Notes (Deleted)
Cardiology Office Note  Date: 04/23/2016   ID: Francis Wilkinson, DOB 07-Oct-1945, MRN [id number]  PCP: Francis Haven, MD  Primary Cardiologist: [name], MD   No chief complaint on file.   History of Present Illness: Francis Wilkinson is a 71 y.o. male that I met back in February 2017. He has had interval follow-up with Dr. Rayann Wilkinson. He did ultimately undergo placement of a Medtronic ICD in April 2017 and has done well since that time.  He continues to follow in the device clinic with Dr. Rayann Wilkinson. Recent thoracic impedance was normal.  Echocardiogram from February last year is outlined below.  Past Medical History:  Diagnosis Date  . CAD (coronary artery disease)    Multivessel status post CABG February 2015   . Diabetes mellitus, type II ([place])    diet controlled  . Essential hypertension   . GERD (gastroesophageal reflux disease)   . Hyperlipidemia   . Ischemic cardiomyopathy    LVEF 25-30% [name], February 2016   . Left atrial enlargement   . Mitral regurgitation    mild to moderate  . Postoperative atrial fibrillation ([name])   . Rheumatoid arthritis ([name])   . Secondary pulmonary hypertension    PASP 69 mmHg  [name], February 2016   . Skin cancer    pt denies  . Tricuspid regurgitation    moderate    Past Surgical History:  Procedure Laterality Date  . CORONARY ARTERY BYPASS GRAFT N/A 05/16/2013   Procedure: CORONARY ARTERY BYPASS GRAFTING (CABG);  Surgeon: Francis Poot, MD;  Location: [place];  Service: Open Heart Surgery;  Laterality: N/A;  Times 2 using left internal mammary artery and endoscopically harvested right saphenous vein.  . EP IMPLANTABLE DEVICE N/A 07/06/2015   Procedure: ICD Implant;  Surgeon: Francis Grayer, MD;  Location: [place] CV [facility];  Service: Cardiovascular;  Laterality: N/A;  . INGUINAL HERNIA REPAIR Left ~ 1970  . INTRAOPERATIVE TRANSESOPHAGEAL ECHOCARDIOGRAM N/A 05/16/2013   Procedure: INTRAOPERATIVE TRANSESOPHAGEAL  ECHOCARDIOGRAM;  Surgeon: Francis Poot, MD;  Location: [place];  Service: Open Heart Surgery;  Laterality: N/A;  . LEFT HEART CATHETERIZATION WITH CORONARY ANGIOGRAM N/A 05/12/2013   Procedure: LEFT HEART CATHETERIZATION WITH CORONARY ANGIOGRAM;  Surgeon: Francis M Martinique, MD;  Location: [facility] CATH LAB;  Service: Cardiovascular;  Laterality: N/A;    Current Outpatient Prescriptions  Medication Sig Dispense Refill  . acetaminophen (TYLENOL) 325 MG tablet Take 325-650 mg by mouth daily as needed (pain).     [name] aspirin 81 MG EC tablet TAKE 1 TABLET (81 MG TOTAL) BY MOUTH DAILY. 90 tablet 3  . CALCIUM-VITAMIN D PO Take 1 tablet by mouth daily.    . carvedilol (COREG) 25 MG tablet TAKE 1 TABLET BY MOUTH TWICE A DAY *NEEDS APPT WITH Francis Wilkinson FOR REFILLS* 60 tablet 0  . cycloSPORINE (SANDIMMUNE) 100 MG capsule Take 100 mg by mouth daily.     . folic acid (FOLVITE) 1 MG tablet Take 1 mg by mouth daily.    . furosemide (LASIX) 20 MG tablet TAKE 1 TABLET (20 MG TOTAL) BY MOUTH DAILY. 90 tablet 3  . lisinopril (PRINIVIL,ZESTRIL) 20 MG tablet TAKE 1 TABLET BY MOUTH EVERY DAY 30 tablet 6  . methotrexate 2.5 MG tablet Take 15 mg by mouth every Monday.     [name] NIFEdipine (PROCARDIA-XL/ADALAT-CC/NIFEDICAL-XL) 30 MG 24 hr tablet Take 1 tablet by mouth daily.    . rosuvastatin (CRESTOR) 5 MG tablet Take 1 tablet (5 mg  total) by mouth daily. 30 tablet 6   No current facility-administered medications for this visit.    Allergies:  Patient has no known allergies.   Social History: The patient  reports that he quit smoking about 44 years ago. His smoking use included Cigarettes. He started smoking about 54 years ago. He has a 20.00 pack-year smoking history. He has never used smokeless tobacco. He reports that he does not drink alcohol or use drugs.  Family History: The patient's family history includes Coronary artery disease in his brother.   ROS:  Please see the history of present illness. Otherwise, complete  review of systems is positive for {NONE DEFAULTED:18576::"none"}.  All other systems are reviewed and negative.   Physical Exam: VS:  There were no vitals taken for this visit., BMI There is no height or weight on file to calculate BMI.  Wt Readings from Last 3 Encounters:  10/08/15 196 lb 9.6 oz (89.2 kg)  07/06/15 192 lb (87.1 kg)  05/07/15 192 lb 6.4 oz (87.3 kg)    General:  Elderly male, appears comfortable at rest. HEENT: Conjunctiva and lids normal, oropharynx clear with moist mucosa. Neck: Supple, no elevated JVP or carotid bruits, no thyromegaly. Lungs: Clear to auscultation, nonlabored breathing at rest. Cardiac: Indistinct PMI with RRR, no S3, 2/6 systolic murmur, no pericardial rub. Abdomen: Soft, nontender, bowel sounds present, no guarding or rebound. Extremities: No pitting edema, distal pulses 2+. Skin: Warm and dry. Musculoskeletal: No kyphosis. Arthritic deformities in hands and feet consistent with rheumatoid arthritis. Neuropsychiatric: Alert and oriented x3, affect grossly appropriate.  ECG: I personally reviewed the tracing from 10/08/2015 which showed sinus rhythm with PVCs, leftward axis, and IVCD.  Recent Labwork: 06/22/2015: BUN 20; Creat 1.20; Hemoglobin 11.9; Platelets 108; Potassium 3.8; Sodium 135     Component Value Date/Time   CHOL 119 05/12/2013 0043   TRIG 131 05/12/2013 0043   HDL 15 (L) 05/12/2013 0043   CHOLHDL 7.9 05/12/2013 0043   VLDL 26 05/12/2013 0043   LDLCALC 78 05/12/2013 0043    Other Studies Reviewed Today:  Echocardiogram 05/02/2015: Study Conclusions  - Left ventricle: The cavity size was normal. Wall thickness was   increased in a pattern of moderate LVH. Systolic function was   moderately to severely reduced. The estimated ejection fraction   was in the range of 30% to 35%. Global hypokinesis. Features are   consistent with a pseudonormal left ventricular filling pattern,   with concomitant abnormal relaxation and increased  filling   pressure (grade 2 diastolic dysfunction). Doppler parameters are   consistent with high ventricular filling pressure. - Aortic valve: Moderately calcified annulus. Mildly thickened,   mildly calcified leaflets. - Mitral valve: Mildly thickened leaflets . There was mild to   moderate regurgitation. - Left atrium: The atrium was moderately to severely dilated. - Right ventricle: Systolic function was mildly to moderately   reduced. - Tricuspid valve: There was mild-moderate regurgitation.  Assessment and Plan:    Current medicines were reviewed with the patient today.  No orders of the defined types were placed in this encounter.   Disposition:  Signed, Satira Sark, MD, [facility] 04/23/2016 10:22 AM    [facility] [place], [place], [place] [address] Phone: [phone]; Fax: [phone]

## 2016-04-24 ENCOUNTER — Ambulatory Visit: Payer: Medicare Other | Admitting: Cardiology

## 2016-05-01 ENCOUNTER — Ambulatory Visit (INDEPENDENT_AMBULATORY_CARE_PROVIDER_SITE_OTHER): Payer: Medicare Other | Admitting: *Deleted

## 2016-05-01 DIAGNOSIS — I255 Ischemic cardiomyopathy: Secondary | ICD-10-CM | POA: Diagnosis not present

## 2016-05-01 DIAGNOSIS — I5022 Chronic systolic (congestive) heart failure: Secondary | ICD-10-CM | POA: Diagnosis not present

## 2016-05-01 DIAGNOSIS — Z95810 Presence of automatic (implantable) cardiac defibrillator: Secondary | ICD-10-CM

## 2016-05-01 NOTE — Progress Notes (Signed)
Remote ICD transmission.   

## 2016-05-01 NOTE — Progress Notes (Signed)
EPIC Encounter for ICM Monitoring  Patient Name: Francis Wilkinson is a 71 y.o. male Date: 05/01/2016 Primary Care Physican: [name], MD Primary Cardiologist:McDowell Electrophysiologist: Allred Dry Weight: 190 lbs                                             Heart Failure questions reviewed, pt asymptomatic    Thoracic impedance normal   Current prescribed dose of Furosemide 20 mg 1 tablet daily  Recommendations: No changes. Reminded to limit dietary salt intake to 2000 mg/day and fluid intake to < 2 liters/day. Encouraged to call for fluid symptoms.  Follow-up plan: ICM clinic phone appointment on 06/03/2016.  Office appointment with Dr Domenic Polite 05/26/2016.  Copy of ICM check sent to device physician.   3 month ICM trend: 05/01/2016   1 Year ICM trend:      Rosalene Billings, RN 05/01/2016 1:33 PM

## 2016-05-02 ENCOUNTER — Other Ambulatory Visit: Payer: Self-pay | Admitting: Cardiology

## 2016-05-06 ENCOUNTER — Encounter: Payer: Self-pay | Admitting: Cardiology

## 2016-05-09 LAB — CUP PACEART REMOTE DEVICE CHECK
Battery Remaining Longevity: 134 mo
Battery Voltage: 3.07 V
Date Time Interrogation Session: 20180215094222
HIGH POWER IMPEDANCE MEASURED VALUE: 74 Ohm
Implantable Pulse Generator Implant Date: 20170421
Lead Channel Impedance Value: 285 Ohm
Lead Channel Pacing Threshold Amplitude: 1.25 V
Lead Channel Pacing Threshold Pulse Width: 0.4 ms
Lead Channel Sensing Intrinsic Amplitude: 15.875 mV
Lead Channel Setting Pacing Amplitude: 2.5 V
Lead Channel Setting Pacing Pulse Width: 0.4 ms
MDC IDC LEAD IMPLANT DT: 20170421
MDC IDC LEAD LOCATION: 753860
MDC IDC MSMT LEADCHNL RV IMPEDANCE VALUE: 418 Ohm
MDC IDC MSMT LEADCHNL RV SENSING INTR AMPL: 15.875 mV
MDC IDC SET LEADCHNL RV SENSING SENSITIVITY: 0.3 mV
MDC IDC STAT BRADY RV PERCENT PACED: 1.42 %

## 2016-05-26 ENCOUNTER — Telehealth: Payer: Self-pay | Admitting: Cardiology

## 2016-05-26 ENCOUNTER — Ambulatory Visit (INDEPENDENT_AMBULATORY_CARE_PROVIDER_SITE_OTHER): Payer: Medicare Other | Admitting: Cardiology

## 2016-05-26 ENCOUNTER — Encounter: Payer: Self-pay | Admitting: Cardiology

## 2016-05-26 ENCOUNTER — Telehealth: Payer: Self-pay

## 2016-05-26 VITALS — BP 121/78 | HR 66 | Ht 69.0 in | Wt 186.6 lb

## 2016-05-26 DIAGNOSIS — I340 Nonrheumatic mitral (valve) insufficiency: Secondary | ICD-10-CM

## 2016-05-26 DIAGNOSIS — I255 Ischemic cardiomyopathy: Secondary | ICD-10-CM

## 2016-05-26 DIAGNOSIS — E782 Mixed hyperlipidemia: Secondary | ICD-10-CM

## 2016-05-26 DIAGNOSIS — I2510 Atherosclerotic heart disease of native coronary artery without angina pectoris: Secondary | ICD-10-CM

## 2016-05-26 MED ORDER — CARVEDILOL 25 MG PO TABS: ORAL_TABLET | ORAL | 6 refills | Status: DC

## 2016-05-26 NOTE — Patient Instructions (Signed)

## 2016-05-26 NOTE — Telephone Encounter (Signed)
Spoke with pt. Pt stated that he would be out of the country the whole month of May, informed pt that his monitor would work as long as there was cell phone signal, but d/t [facility] not being able to treat emergently or advise pt should go to the hospital if he receives a shock or has any syncopal episodes. Pt voiced understanding.

## 2016-05-26 NOTE — Progress Notes (Signed)
Cardiology Office Note  Date: 05/26/2016   ID: Francis Wilkinson, DOB 11/09/1945, MRN [id number]  PCP: Francis Haven, MD  Primary Cardiologist: Francis Lesches, MD   Chief Complaint  Patient presents with  . Coronary Artery Disease  . Cardiomyopathy    History of Present Illness: Francis Wilkinson is a 71 y.o. male last seen in February 2017, a former patient of Dr. Ron Wilkinson. Since I last saw him, he had a follow-up echocardiogram showing persistent cardiomyopathy and was referred to Dr. Rayann Wilkinson, ultimately underwent placement of ICD. He presents overdue for follow-up. Overall doing well, no angina symptoms, no palpitations or device discharges. He reports NYHA class II dyspnea. States that he plans to take an extended trip with his family to [place], [place], and [place] in May.  He continues to follow in the device clinic with Dr. Rayann Wilkinson, Medtronic ICD in place. Recent thoracic impedance measurements were normal. He has had no device shocks. He does not report any orthopnea or leg swelling. Weight is down 10 pounds from last summer.  I reviewed his current cardiac medications which are outlined below. We did discuss the possibility of changing from lisinopril to [facility], he did not want to make any adjustments as yet. I reviewed his most recent lab work which is outlined below.  Last echocardiogram was in February 2017.  Past Medical History:  Diagnosis Date  . CAD (coronary artery disease)    Multivessel status post CABG February 2015   . Diabetes mellitus, type II ([place])    Diet controlled  . Essential hypertension   . GERD (gastroesophageal reflux disease)   . Hyperlipidemia   . Ischemic cardiomyopathy    LVEF 25-30% Francis Wilkinson, February 2016   . Mitral regurgitation    Mild to moderate  . Postoperative atrial fibrillation ([place])   . Rheumatoid arthritis ([place])   . Secondary pulmonary hypertension    PASP 69 mmHg  Francis Wilkinson, February 2016   . Skin cancer   .  Tricuspid regurgitation    Moderate    Past Surgical History:  Procedure Laterality Date  . CORONARY ARTERY BYPASS GRAFT N/A 05/16/2013   Procedure: CORONARY ARTERY BYPASS GRAFTING (CABG);  Surgeon: [name], MD;  Location: [place];  Service: Open Heart Surgery;  Laterality: N/A;  Times 2 using left internal mammary artery and endoscopically harvested right saphenous vein.  . EP IMPLANTABLE DEVICE N/A 07/06/2015   Procedure: ICD Implant;  Surgeon: [name], MD;  Location: [place] CV LAB;  Service: Cardiovascular;  Laterality: N/A;  . INGUINAL HERNIA REPAIR Left ~ 1970  . INTRAOPERATIVE TRANSESOPHAGEAL ECHOCARDIOGRAM N/A 05/16/2013   Procedure: INTRAOPERATIVE TRANSESOPHAGEAL ECHOCARDIOGRAM;  Surgeon: [name], MD;  Location: [place];  Service: Open Heart Surgery;  Laterality: N/A;  . LEFT HEART CATHETERIZATION WITH CORONARY ANGIOGRAM N/A 05/12/2013   Procedure: LEFT HEART CATHETERIZATION WITH CORONARY ANGIOGRAM;  Surgeon: [name], MD;  Location: [facility] CATH LAB;  Service: Cardiovascular;  Laterality: N/A;    Current Outpatient Prescriptions  Medication Sig Dispense Refill  . acetaminophen (TYLENOL) 325 MG tablet Take 325-650 mg by mouth daily as needed (pain).     [name] aspirin 81 MG EC tablet TAKE 1 TABLET (81 MG TOTAL) BY MOUTH DAILY. 90 tablet 3  . CALCIUM-VITAMIN D PO Take 1 tablet by mouth daily.    . carvedilol (COREG) 25 MG tablet TAKE 1 TABLET BY MOUTH TWICE A DAY 60 tablet 6  . cycloSPORINE (SANDIMMUNE) 100 MG capsule Take 100  mg by mouth daily.     . folic acid (FOLVITE) 1 MG tablet Take 1 mg by mouth daily.    . furosemide (LASIX) 20 MG tablet TAKE 1 TABLET (20 MG TOTAL) BY MOUTH DAILY. 90 tablet 3  . lisinopril (PRINIVIL,ZESTRIL) 20 MG tablet TAKE 1 TABLET BY MOUTH EVERY DAY 30 tablet 6  . methotrexate 2.5 MG tablet Take 15 mg by mouth every Monday.     [name] NIFEdipine (PROCARDIA-XL/ADALAT-CC/NIFEDICAL-XL) 30 MG 24 hr tablet Take 1 tablet by mouth daily.    .  rosuvastatin (CRESTOR) 5 MG tablet Take 1 tablet (5 mg total) by mouth daily. 30 tablet 6   No current facility-administered medications for this visit.    Allergies:  Patient has no known allergies.   Social History: The patient  reports that he quit smoking about 44 years ago. His smoking use included Cigarettes. He started smoking about 54 years ago. He has a 20.00 pack-year smoking history. He has never used smokeless tobacco. He reports that he does not drink alcohol or use drugs.   ROS:  Please see the history of present illness. Otherwise, complete review of systems is positive for arthritic symptoms.  All other systems are reviewed and negative.   Physical Exam: VS:  BP 121/78   Pulse 66   Ht 5\' 9"  (1.753 m)   Wt 186 lb 9.6 oz (84.6 kg)   BMI 27.56 kg/m , BMI Body mass index is 27.56 kg/m.  Wt Readings from Last 3 Encounters:  05/26/16 186 lb 9.6 oz (84.6 kg)  10/08/15 196 lb 9.6 oz (89.2 kg)  07/06/15 192 lb (87.1 kg)    General:  Elderly male, appears comfortable at rest. HEENT: Conjunctiva and lids normal, oropharynx clear with moist mucosa. Neck: Supple, no elevated JVP or carotid bruits, no thyromegaly. Lungs: Clear to auscultation, nonlabored breathing at rest. Cardiac: Indistinct PMI with RRR, no S3, 2/6 systolic murmur, no pericardial rub. Abdomen: Soft, nontender, bowel sounds present, no guarding or rebound. Extremities: No pitting edema, distal pulses 2+. Skin: Warm and dry. Musculoskeletal: No kyphosis. Arthritic deformities in hands and feet consistent with rheumatoid arthritis. Neuropsychiatric: Alert and oriented x3, affect grossly appropriate.  ECG: I personally reviewed the tracing from 10/08/2015 which showed sinus rhythm with prolonged PR interval, intermittent PVCs, leftward axis and IVCD, nonspecific T-wave changes.  Recent Labwork: 06/22/2015: BUN 20; Creat 1.20; Hemoglobin 11.9; Platelets 108; Potassium 3.8; Sodium 135     Component Value Date/Time     CHOL 119 05/12/2013 0043   TRIG 131 05/12/2013 0043   HDL 15 (L) 05/12/2013 0043   CHOLHDL 7.9 05/12/2013 0043   VLDL 26 05/12/2013 0043   LDLCALC 78 05/12/2013 0043  September 2017: Cholesterol 126, HDL 16, LDL 81, triglycerides 145, BUN 28, creatinine 1.45, potassium 4.0  Other Studies Reviewed Today:  Echocardiogram 05/02/2015: Study Conclusions  - Left ventricle: The cavity size was normal. Wall thickness was   increased in a pattern of moderate LVH. Systolic function was   moderately to severely reduced. The estimated ejection fraction   was in the range of 30% to 35%. Global hypokinesis. Features are   consistent with a pseudonormal left ventricular filling pattern,   with concomitant abnormal relaxation and increased filling   pressure (grade 2 diastolic dysfunction). Doppler parameters are   consistent with high ventricular filling pressure. - Aortic valve: Moderately calcified annulus. Mildly thickened,   mildly calcified leaflets. - Mitral valve: Mildly thickened leaflets . There was mild to  moderate regurgitation. - Left atrium: The atrium was moderately to severely dilated. - Right ventricle: Systolic function was mildly to moderately   reduced. - Tricuspid valve: There was mild-moderate regurgitation.  Cardiac catheterization 05/12/2013 (pre-CABG): Coronary dominance: right  Left mainstem: Mild irregularities.  Left anterior descending (LAD): Severely calcified proximally. Diffuse 30% proximal. There is a 95% stenosis in the mid vessel after a small diagonal.   Left circumflex (LCx): 70% proximal. The first OM is occluded with faint collateral flow. The second OM is without significant disease.   Right coronary artery (RCA): The RCA is a large ectatic vessel. It is severely calcified throughout. There is segmental 40% disease in the proximal to mid vessel with a 60-70% lesion at the crux. The PDA has 60-70% disease proximally. The second PLOM branch is  occluded and fills by left to right collaterals.  Left ventriculography: Left ventricular systolic function is abnormal, there is moderate to severe anterior hypokinesis, LVEF is estimated at 40-45%%, there is no significant mitral regurgitation   Final Conclusions:  1. Severe 3 vessel obstructive CAD 2. Moderate LV dysfunction.  Cardiac catheterization 05/12/2013 (pre-CABG): Coronary dominance: right  Left mainstem: Mild irregularities.  Left anterior descending (LAD): Severely calcified proximally. Diffuse 30% proximal. There is a 95% stenosis in the mid vessel after a small diagonal.   Left circumflex (LCx): 70% proximal. The first OM is occluded with faint collateral flow. The second OM is without significant disease.   Right coronary artery (RCA): The RCA is a large ectatic vessel. It is severely calcified throughout. There is segmental 40% disease in the proximal to mid vessel with a 60-70% lesion at the crux. The PDA has 60-70% disease proximally. The second PLOM branch is occluded and fills by left to right collaterals.  Left ventriculography: Left ventricular systolic function is abnormal, there is moderate to severe anterior hypokinesis, LVEF is estimated at 40-45%%, there is no significant mitral regurgitation   Final Conclusions:  1. Severe 3 vessel obstructive CAD 2. Moderate LV dysfunction.  Assessment and Plan:  1. Multivessel CAD status post CABG in 2015. He does not report any angina symptoms on medical therapy and we will continue with observation at this point.  2. Ischemic cardiomyopathy with LVEF 30-35%. Clinically stable on medical therapy including Coreg, lisinopril, and Lasix. We did discuss potential switch to [facility], but did not want make any adjustments as yet. Follow-up echocardiogram.  3. Mitral regurgitation, mild to moderate by most recent echocardiogram.  4. Hyperlipidemia, on Crestor. Last LDL 81.  Current medicines were reviewed with  the patient today.   Orders Placed This Encounter  Procedures  . ECHOCARDIOGRAM COMPLETE    Disposition: Follow-up in 6 months.  Signed, Satira Sark, MD, [facility] 05/26/2016 9:18 AM    [place] [facility] [place], [place], [place] [address] Phone: [phone]; Fax: [phone]

## 2016-05-26 NOTE — Telephone Encounter (Signed)
Echo scheduled in [place] March 22

## 2016-06-03 ENCOUNTER — Telehealth: Payer: Self-pay | Admitting: Cardiology

## 2016-06-03 ENCOUNTER — Ambulatory Visit (INDEPENDENT_AMBULATORY_CARE_PROVIDER_SITE_OTHER): Payer: Medicare Other

## 2016-06-03 DIAGNOSIS — I5022 Chronic systolic (congestive) heart failure: Secondary | ICD-10-CM | POA: Diagnosis not present

## 2016-06-03 DIAGNOSIS — Z95810 Presence of automatic (implantable) cardiac defibrillator: Secondary | ICD-10-CM

## 2016-06-03 NOTE — Telephone Encounter (Signed)
Spoke with pt and reminded pt of remote transmission that is due today. Pt verbalized understanding.   

## 2016-06-03 NOTE — Progress Notes (Signed)
EPIC Encounter for ICM Monitoring  Patient Name: Francis Wilkinson is a 71 y.o. male Date: 06/03/2016 Primary Care Physican: [name], MD Primary Cardiologist:McDowell Electrophysiologist: Allred Dry Weight: 189 lbs     Heart Failure questions reviewed, pt asymptomatic.   Thoracic impedance normal.  Prescribed and confirmed dosage: Furosemide 20 mg 1 tablet daily  Recommendations: No changes. Reminded to limit dietary salt intake to 2000 mg/day and fluid intake to < 2 liters/day. Encouraged to call for fluid symptoms.  Follow-up plan: ICM clinic phone appointment on 07/11/2016  Copy of ICM check sent to device physician.   3 month ICM trend: 06/03/2016   1 Year ICM trend:      Rosalene Billings, RN 06/03/2016 12:20 PM

## 2016-06-05 ENCOUNTER — Other Ambulatory Visit: Payer: Self-pay

## 2016-06-05 ENCOUNTER — Telehealth: Payer: Self-pay | Admitting: Cardiology

## 2016-06-05 ENCOUNTER — Telehealth: Payer: Self-pay

## 2016-06-05 ENCOUNTER — Ambulatory Visit (INDEPENDENT_AMBULATORY_CARE_PROVIDER_SITE_OTHER): Payer: Medicare Other

## 2016-06-05 DIAGNOSIS — I255 Ischemic cardiomyopathy: Secondary | ICD-10-CM | POA: Diagnosis not present

## 2016-06-05 MED ORDER — CARVEDILOL 25 MG PO TABS: ORAL_TABLET | ORAL | 3 refills | Status: AC

## 2016-06-05 NOTE — Telephone Encounter (Signed)
PATIENT NOTIFIED.

## 2016-06-05 NOTE — Telephone Encounter (Signed)
Done

## 2016-06-05 NOTE — Telephone Encounter (Signed)
-----   Message from Satira Sark, MD sent at 06/05/2016 12:48 PM EDT ----- Results reviewed. LVEF is stable at 35%. Continue medical therapy as discussed in recent note. A copy of this test should be forwarded to [facility], MD.

## 2016-06-05 NOTE — Telephone Encounter (Signed)
carvedilol (COREG) 25 MG tablet   [facility] [place] Patient walked in asking for 90 day supply not 60 day

## 2016-07-01 ENCOUNTER — Other Ambulatory Visit: Payer: Self-pay | Admitting: *Deleted

## 2016-07-01 MED ORDER — LISINOPRIL 20 MG PO TABS: 20.0000 mg | ORAL_TABLET | Freq: Every day | ORAL | 1 refills | Status: AC

## 2016-07-11 ENCOUNTER — Ambulatory Visit (INDEPENDENT_AMBULATORY_CARE_PROVIDER_SITE_OTHER): Payer: Medicare Other

## 2016-07-11 DIAGNOSIS — Z95810 Presence of automatic (implantable) cardiac defibrillator: Secondary | ICD-10-CM

## 2016-07-11 DIAGNOSIS — I5022 Chronic systolic (congestive) heart failure: Secondary | ICD-10-CM

## 2016-07-11 NOTE — Progress Notes (Signed)
EPIC Encounter for ICM Monitoring  Patient Name: Francis Wilkinson is a 71 y.o. male Date: 07/11/2016 Primary Care Physican: [name], MD Primary Cardiologist:McDowell Electrophysiologist: Allred Dry Weight: 189lbs      Heart Failure questions reviewed, pt asymptomatic.   Thoracic impedance just below baseline.  Prescribed and confirmed dosage: Furosemide 20 mg 1 tablet daily  Labs: 11/23/2015 Creatinine 1.45, BUN 28, Potassium 4,    Sodium 135, EGFR 48 06/23/2015 Creatinine 1.20, BUN 20, Potassium 3.8, Sodium 135  Recommendations: No changes. Discussed to limit salt intake to 2000 mg/day and fluid intake to < 2 liters/day.  Encouraged to call for fluid symptoms.  Follow-up plan: ICM clinic phone appointment on 08/21/2016.  Copy of ICM check sent to primary cardiologist and device physician.   3 month ICM trend: 07/11/2016   1 Year ICM trend:      Rosalene Billings, RN 07/11/2016 11:17 AM

## 2016-08-21 ENCOUNTER — Ambulatory Visit (INDEPENDENT_AMBULATORY_CARE_PROVIDER_SITE_OTHER): Payer: Medicare Other | Admitting: *Deleted

## 2016-08-21 DIAGNOSIS — Z95810 Presence of automatic (implantable) cardiac defibrillator: Secondary | ICD-10-CM | POA: Diagnosis not present

## 2016-08-21 DIAGNOSIS — I255 Ischemic cardiomyopathy: Secondary | ICD-10-CM

## 2016-08-21 DIAGNOSIS — I5022 Chronic systolic (congestive) heart failure: Secondary | ICD-10-CM

## 2016-08-21 NOTE — Progress Notes (Signed)
EPIC Encounter for ICM Monitoring  Patient Name: Francis Wilkinson is a 71 y.o. male Date: 08/21/2016 Primary Care Physican: [name], MD Primary Cardiologist:McDowell Electrophysiologist: Allred Dry Weight: 180lbs                                       Heart Failure questions reviewed, pt asymptomatic.   Thoracic impedance just below baseline.  Prescribed and confirmed dosage: Furosemide 20 mg 1 tablet daily  Labs: 11/23/2015 Creatinine 1.45, BUN 28, Potassium 4,    Sodium 135, EGFR 48 06/23/2015 Creatinine 1.20, BUN 20, Potassium 3.8, Sodium 135  Recommendations: No changes. Discussed to limit salt intake to 2000 mg/day and fluid intake to < 2 liters/day.  Encouraged to call for fluid symptoms.  Follow-up plan: ICM clinic phone appointment on October 27, 2016.  Office appointment scheduled on 09/19/2016 with Dr. [name].  Copy of ICM check sent to device physician.   3 month ICM trend: 08/21/2016   1 Year ICM trend:      Rosalene Billings, RN 08/21/2016 4:52 PM

## 2016-08-22 LAB — CUP PACEART REMOTE DEVICE CHECK
Battery Remaining Longevity: 133 mo
Battery Voltage: 3.05 V
Brady Statistic RV Percent Paced: 1.09 %
Date Time Interrogation Session: 20180607073423
HighPow Impedance: 77 Ohm
Lead Channel Impedance Value: 285 Ohm
Lead Channel Impedance Value: 399 Ohm
Lead Channel Sensing Intrinsic Amplitude: 11.875 mV
Lead Channel Setting Pacing Amplitude: 2.25 V
Lead Channel Setting Sensing Sensitivity: 0.3 mV
MDC IDC LEAD IMPLANT DT: 20170421
MDC IDC LEAD LOCATION: 753860
MDC IDC MSMT LEADCHNL RV PACING THRESHOLD AMPLITUDE: 1 V
MDC IDC MSMT LEADCHNL RV PACING THRESHOLD PULSEWIDTH: 0.4 ms
MDC IDC MSMT LEADCHNL RV SENSING INTR AMPL: 11.875 mV
MDC IDC PG IMPLANT DT: 20170421
MDC IDC SET LEADCHNL RV PACING PULSEWIDTH: 0.4 ms

## 2016-08-23 ENCOUNTER — Other Ambulatory Visit: Payer: Self-pay | Admitting: Cardiology

## 2016-08-27 ENCOUNTER — Encounter: Payer: Self-pay | Admitting: Cardiology

## 2016-09-19 ENCOUNTER — Ambulatory Visit (INDEPENDENT_AMBULATORY_CARE_PROVIDER_SITE_OTHER): Payer: Medicare Other | Admitting: Internal Medicine

## 2016-09-19 ENCOUNTER — Encounter: Payer: Self-pay | Admitting: Internal Medicine

## 2016-09-19 VITALS — BP 118/68 | HR 64 | Ht 69.0 in | Wt 182.0 lb

## 2016-09-19 DIAGNOSIS — I255 Ischemic cardiomyopathy: Secondary | ICD-10-CM | POA: Diagnosis not present

## 2016-09-19 DIAGNOSIS — I5022 Chronic systolic (congestive) heart failure: Secondary | ICD-10-CM

## 2016-09-19 DIAGNOSIS — I2589 Other forms of chronic ischemic heart disease: Secondary | ICD-10-CM

## 2016-09-19 DIAGNOSIS — Z95810 Presence of automatic (implantable) cardiac defibrillator: Secondary | ICD-10-CM

## 2016-09-19 NOTE — Progress Notes (Signed)
PCP: [name], MD Primary Cardiologist:  Dr Evlyn Clines is a 71 y.o. male who presents today for routine electrophysiology followup.  Since last being seen in our clinic, the patient reports doing very well.  He went on a trip to [place], [place], and [place] in May.  Today, he denies symptoms of palpitations, chest pain, shortness of breath,  lower extremity edema, dizziness, presyncope, syncope, or ICD shocks.  The patient is otherwise without complaint today.   Past Medical History:  Diagnosis Date  . CAD (coronary artery disease)    Multivessel status post CABG February 2015   . Diabetes mellitus, type II ([place])    Diet controlled  . Essential hypertension   . GERD (gastroesophageal reflux disease)   . Hyperlipidemia   . Ischemic cardiomyopathy    LVEF 25-30% [name], February 2016   . Mitral regurgitation    Mild to moderate  . Postoperative atrial fibrillation ([place])   . Rheumatoid arthritis ([place])   . Secondary pulmonary hypertension    PASP 69 mmHg  [name], February 2016   . Skin cancer   . Tricuspid regurgitation    Moderate   Past Surgical History:  Procedure Laterality Date  . CORONARY ARTERY BYPASS GRAFT N/A 05/16/2013   Procedure: CORONARY ARTERY BYPASS GRAFTING (CABG);  Surgeon: [name], MD;  Location: [facility];  Service: Open Heart Surgery;  Laterality: N/A;  Times 2 using left internal mammary artery and endoscopically harvested right saphenous vein.  . EP IMPLANTABLE DEVICE N/A 07/06/2015   Procedure: ICD Implant;  Surgeon: [name], MD;  Location: [facility];  Service: Cardiovascular;  Laterality: N/A;  . INGUINAL HERNIA REPAIR Left ~ 1970  . INTRAOPERATIVE TRANSESOPHAGEAL ECHOCARDIOGRAM N/A 05/16/2013   Procedure: INTRAOPERATIVE TRANSESOPHAGEAL ECHOCARDIOGRAM;  Surgeon: [name], MD;  Location: [facility];  Service: Open Heart Surgery;  Laterality: N/A;  . LEFT HEART CATHETERIZATION WITH CORONARY ANGIOGRAM N/A  05/12/2013   Procedure: LEFT HEART CATHETERIZATION WITH CORONARY ANGIOGRAM;  Surgeon: [name], MD;  Location: [facility] CATH [facility];  Service: Cardiovascular;  Laterality: N/A;    ROS- all systems are reviewed and negative except as per HPI above  Current Outpatient Prescriptions  Medication Sig Dispense Refill  . acetaminophen (TYLENOL) 325 MG tablet Take 325-650 mg by mouth daily as needed (pain).     [name] aspirin 81 MG EC tablet TAKE 1 TABLET (81 MG TOTAL) BY MOUTH DAILY. 90 tablet 3  . CALCIUM-VITAMIN D PO Take 1 tablet by mouth daily.    . carvedilol (COREG) 25 MG tablet TAKE 1 TABLET BY MOUTH TWICE A DAY 180 tablet 3  . cycloSPORINE (SANDIMMUNE) 100 MG capsule Take 100 mg by mouth daily.     . folic acid (FOLVITE) 1 MG tablet Take 1 mg by mouth daily.    . furosemide (LASIX) 20 MG tablet TAKE 1 TABLET (20 MG TOTAL) BY MOUTH DAILY. 90 tablet 3  . lisinopril (PRINIVIL,ZESTRIL) 20 MG tablet Take 1 tablet (20 mg total) by mouth daily. 90 tablet 1  . methotrexate 2.5 MG tablet Take 15 mg by mouth every Monday.     [name] NIFEdipine (PROCARDIA-XL/ADALAT-CC/NIFEDICAL-XL) 30 MG 24 hr tablet Take 1 tablet by mouth daily.    . rosuvastatin (CRESTOR) 5 MG tablet Take 1 tablet (5 mg total) by mouth daily. 30 tablet 6   No current facility-administered medications for this visit.     Physical Exam: Vitals:   09/19/16 3557  BP: 118/68  Pulse: 64  SpO2: 95%  Weight: 182 lb (82.6 kg)  Height: 5\' 9"  (1.753 m)    GEN- The patient is well appearing, alert and oriented x 3 today.   Head- normocephalic, atraumatic Eyes-  Sclera clear, conjunctiva pink Ears- hearing intact Oropharynx- clear Lungs- Clear to ausculation bilaterally, normal work of breathing Chest- ICD pocket is well healed Heart- Regular rate and rhythm, no murmurs, rubs or gallops, PMI not laterally displaced GI- soft, NT, ND, + BS Extremities- no clubbing, cyanosis, or edema MS- rheumatic deformities of arthritis are noted  ICD  interrogation- reviewed in detail today,  See PACEART report  Assessment and Plan:  1.  Chronic systolic dysfunction euvolemic today Stable on an appropriate medical regimen Normal ICD function See Pace Art report No changes today Followed in ICM device clinic  2. HTN Stable No change required today  3. CAD No ischemic symptoms No changes today  carelink Follow-up with Dr Domenic Polite as scheduled I will see again in 1 year   [name] MD, [facility] 09/19/2016 9:41 AM

## 2016-09-19 NOTE — Patient Instructions (Signed)
Medication Instructions:  Continue all current medications.  Labwork: none  Testing/Procedures: none  Follow-Up: Your physician wants you to follow up in:  1 year.  You will receive a reminder letter in the mail one-two months in advance.  If you don't receive a letter, please call our office to schedule the follow up appointment.  (ALLRED)  Any Other Special Instructions Will Be Listed Below (If Applicable). Remote monitoring is used to monitor your Pacemaker of ICD from home. This monitoring reduces the number of office visits required to check your device to one time per year. It allows [place] to keep an eye on the functioning of your device to ensure it is working properly. You are scheduled for a device check from home on 11-21-16. You may send your transmission at any time that day. If you have a wireless device, the transmission will be sent automatically. After your physician reviews your transmission, you will receive a postcard with your next transmission date.  If you need a refill on your cardiac medications before your next appointment, please call your pharmacy.

## 2016-09-23 LAB — CUP PACEART INCLINIC DEVICE CHECK
Brady Statistic RV Percent Paced: 1.53 %
Date Time Interrogation Session: 20180706133259
HighPow Impedance: 68 Ohm
Implantable Lead Location: 753860
Lead Channel Impedance Value: 418 Ohm
Lead Channel Sensing Intrinsic Amplitude: 10.375 mV
MDC IDC LEAD IMPLANT DT: 20170421
MDC IDC MSMT BATTERY REMAINING LONGEVITY: 133 mo
MDC IDC MSMT BATTERY VOLTAGE: 3.03 V
MDC IDC MSMT LEADCHNL RV IMPEDANCE VALUE: 285 Ohm
MDC IDC MSMT LEADCHNL RV PACING THRESHOLD AMPLITUDE: 1 V
MDC IDC MSMT LEADCHNL RV PACING THRESHOLD PULSEWIDTH: 0.4 ms
MDC IDC MSMT LEADCHNL RV SENSING INTR AMPL: 10.25 mV
MDC IDC PG IMPLANT DT: 20170421
MDC IDC SET LEADCHNL RV PACING AMPLITUDE: 2 V
MDC IDC SET LEADCHNL RV PACING PULSEWIDTH: 0.4 ms
MDC IDC SET LEADCHNL RV SENSING SENSITIVITY: 0.3 mV

## 2016-10-20 ENCOUNTER — Ambulatory Visit: Payer: Medicare Other

## 2016-10-20 DIAGNOSIS — I5022 Chronic systolic (congestive) heart failure: Secondary | ICD-10-CM

## 2016-10-20 DIAGNOSIS — Z95810 Presence of automatic (implantable) cardiac defibrillator: Secondary | ICD-10-CM

## 2016-11-15 NOTE — Progress Notes (Signed)
EPIC Encounter for ICM Monitoring  Patient Name: Francis Wilkinson is a 71 y.o. male Date: 11/08/2016 Primary Care Physican: Stambaugh, Merris, MD Primary Cardiologist:McDowell Electrophysiologist: Allred Dry Weight:178lbs         Spoke with wife. Heart Failure questions reviewed, pt asymptomatic    Thoracic impedance normal.  Prescribed dosage: Furosemide 20 mg 1 tablet daily  Labs: 09/04/2016 Creatinine 1.23, BUN 23, Potassium 4.4, Sodium 135, EGFR 58 11/23/2015 Creatinine 1.45, BUN 28, Potassium 4, Sodium 135, EGFR 48 06/23/2015 Creatinine 1.20, BUN 20, Potassium 3.8, Sodium 135  Recommendations:  No changes.  Encouraged to call for fluid symptoms.  Follow-up plan: ICM clinic phone appointment on 11/21/2016.  Office appointment scheduled 01/12/2017 with Dr. McDowell.  Copy of ICM check sent to device physician.   3 month ICM trend: 10/24/2016   1 Year ICM trend:       S , RN 10/28/2016 12:47 PM    

## 2016-11-15 DEATH — deceased

## 2016-11-21 ENCOUNTER — Telehealth: Payer: Self-pay | Admitting: Cardiology

## 2016-11-21 ENCOUNTER — Encounter: Payer: No Typology Code available for payment source | Admitting: *Deleted

## 2016-11-21 NOTE — Telephone Encounter (Signed)
Confirmed remote transmission w/ pt wife and she informed me that patient passed away on the 2016-10-30.

## 2017-01-12 ENCOUNTER — Ambulatory Visit: Payer: Medicare Other | Admitting: Cardiology

## 2017-07-24 IMAGING — CR DG CHEST 2V
2 series · 2 of 2 positions shown · non-contrast
Comparison: 06/08/2013

CLINICAL DATA: 69-year-old male with a history of pacemaker
placement

EXAM:
CHEST - 2 VIEW

[chest lat]
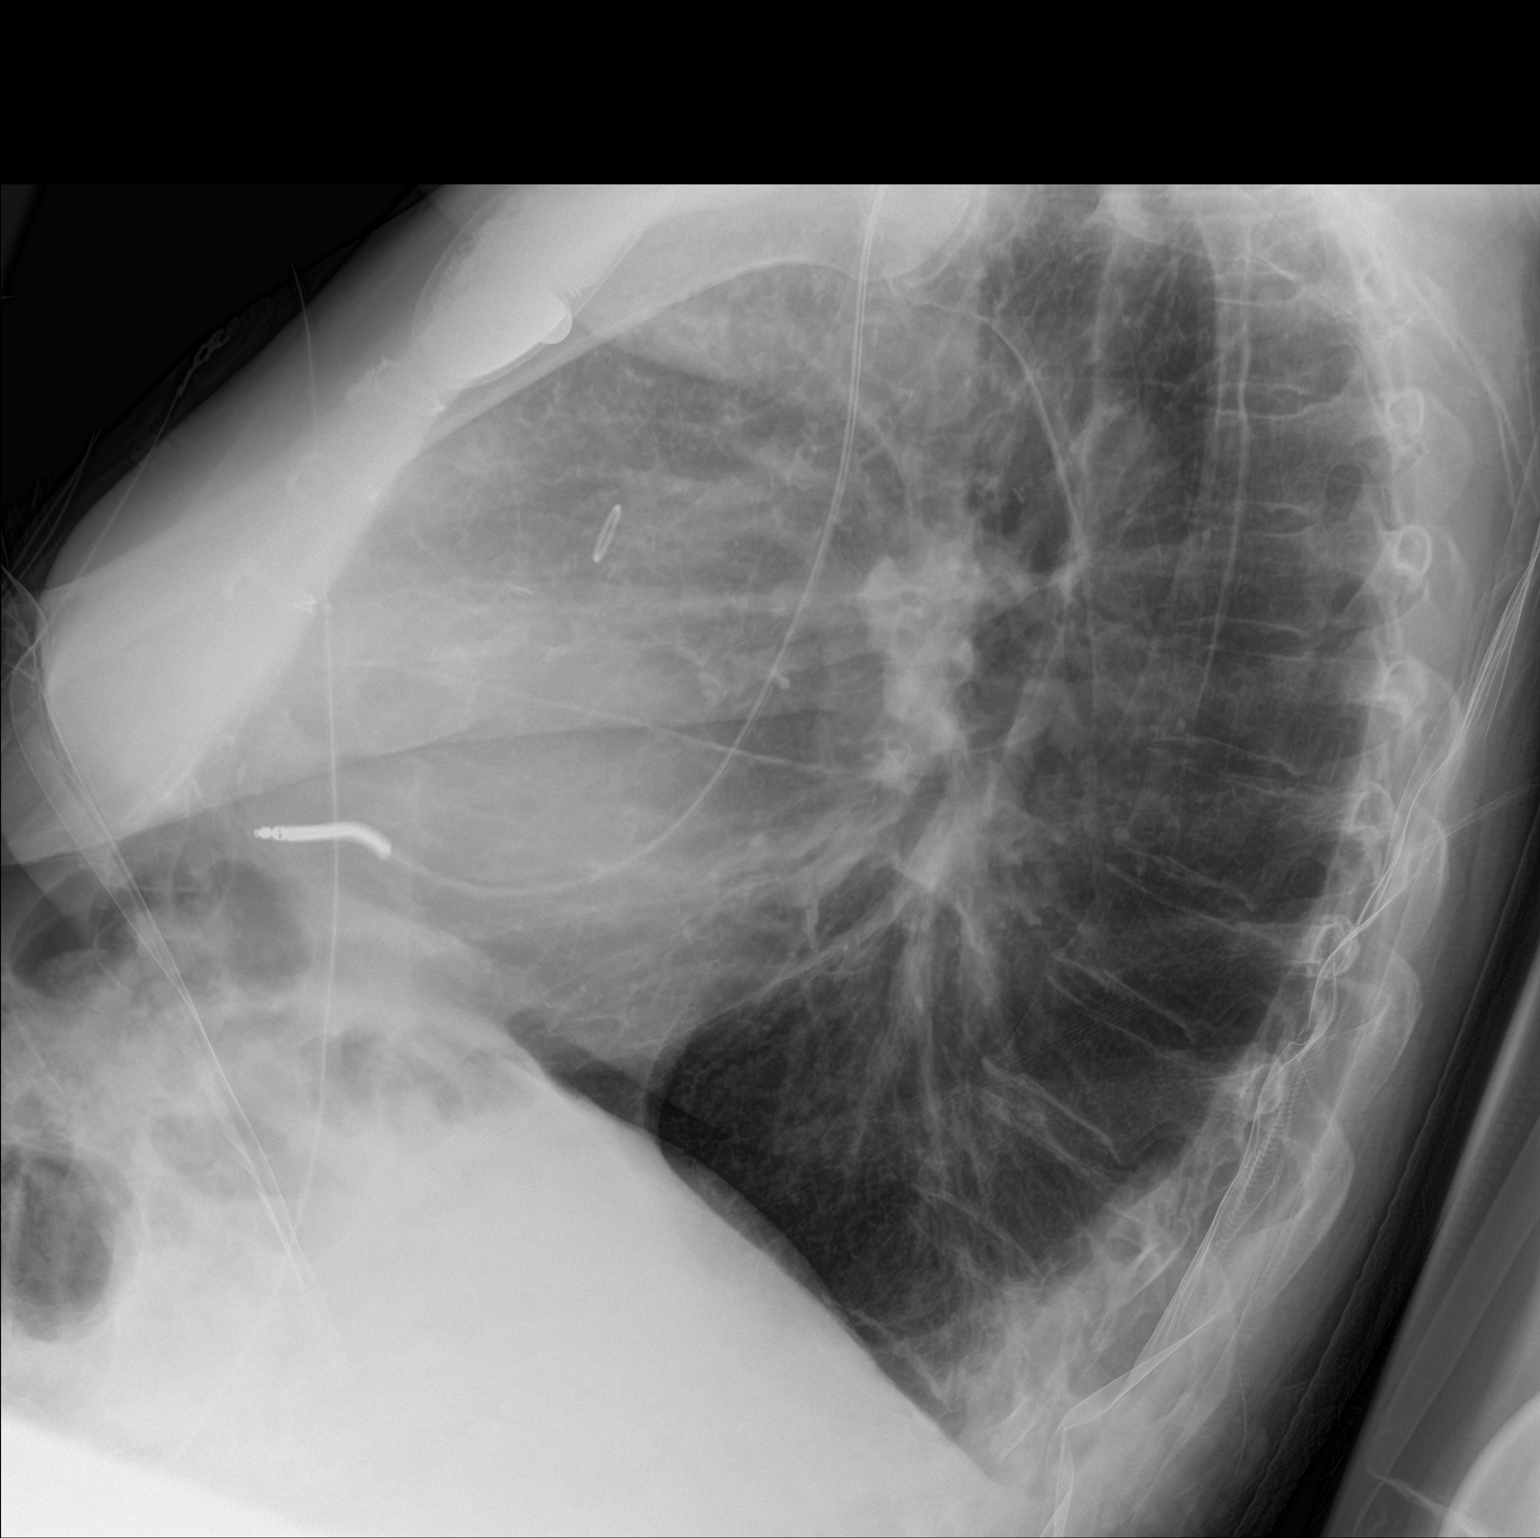

[chest ap]
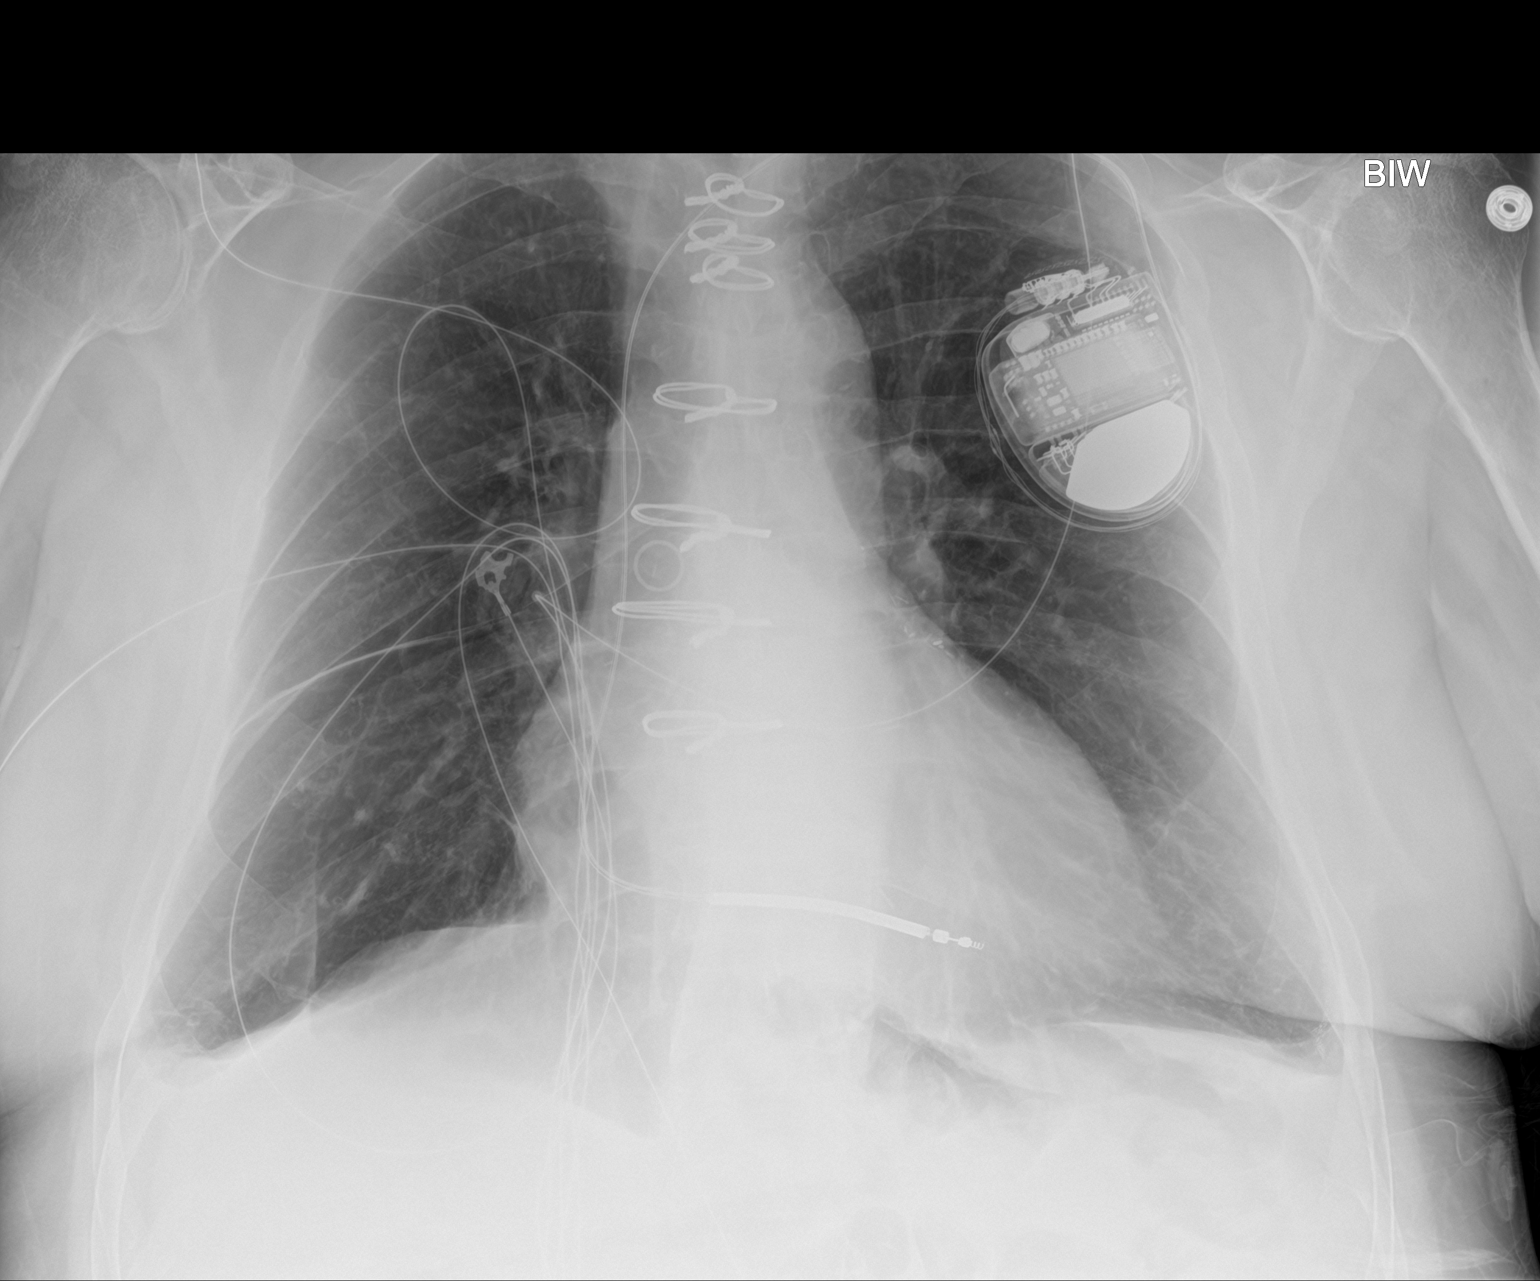

[2 of 2 positions shown; findings below may reference images not displayed]

FINDINGS: Cardiomediastinal silhouette within normal limits in size and
contour.

Surgical changes of prior median sternotomy and CABG again noted.

Overlying EKG leads.

Interval placement of left chest wall cardiac AICD with single lead
via subclavian approach.

No pneumothorax.

No confluent airspace disease.

Blunting of the costophrenic sulcus is unchanged from the
comparison.

No displaced fracture.

Unremarkable appearance of the upper abdomen.
IMPRESSION: Interval placement of left chest wall cardiac AICD with no
complicating features.

Chronic scarring/ fluid of the costophrenic sulcus.

## 2017-09-18 ENCOUNTER — Encounter: Payer: Medicare Other | Admitting: Internal Medicine
